# Patient Record
Sex: Male | Born: 1975 | Race: White | Hispanic: No | Marital: Single | Smoking: Never smoker
Health system: Southern US, Community
[De-identification: ages and names within clinical notes are randomized; demographics above are authoritative.]

## PROBLEM LIST (undated history)

## (undated) DIAGNOSIS — I10 Essential (primary) hypertension: Secondary | ICD-10-CM

## (undated) DIAGNOSIS — K219 Gastro-esophageal reflux disease without esophagitis: Secondary | ICD-10-CM

## (undated) DIAGNOSIS — G43909 Migraine, unspecified, not intractable, without status migrainosus: Secondary | ICD-10-CM

## (undated) DIAGNOSIS — Z9889 Other specified postprocedural states: Secondary | ICD-10-CM

## (undated) DIAGNOSIS — R112 Nausea with vomiting, unspecified: Secondary | ICD-10-CM

## (undated) HISTORY — PX: KNEE SURGERY: SHX244

## (undated) HISTORY — PX: SHOULDER SURGERY: SHX246

## (undated) HISTORY — DX: Migraine, unspecified, not intractable, without status migrainosus: G43.909

## (undated) HISTORY — PX: WISDOM TOOTH EXTRACTION: SHX21

---

## 2015-11-20 ENCOUNTER — Ambulatory Visit (HOSPITAL_COMMUNITY)
Admission: EM | Admit: 2015-11-20 | Discharge: 2015-11-20 | Disposition: A | Discharge status: Home / Self Care | Payer: Managed Care, Other (non HMO) | Attending: Family Medicine | Admitting: Family Medicine

## 2015-11-20 ENCOUNTER — Encounter (HOSPITAL_COMMUNITY): Payer: Self-pay | Admitting: *Deleted

## 2015-11-20 DIAGNOSIS — R1030 Lower abdominal pain, unspecified: Secondary | ICD-10-CM

## 2015-11-20 HISTORY — DX: Essential (primary) hypertension: I10

## 2015-11-20 MED ORDER — OMEPRAZOLE 20 MG PO CPDR: 20.0000 mg | DELAYED_RELEASE_CAPSULE | Freq: Every day | ORAL | 0 refills | Status: DC

## 2015-11-20 MED ORDER — POLYETHYLENE GLYCOL 3350 17 GM/SCOOP PO POWD: 1.0000 | Freq: Two times a day (BID) | ORAL | 0 refills | Status: DC

## 2015-11-20 NOTE — Discharge Instructions (Signed)
If you're not better in the next couple days, please call Dr. Russella DarStark for further evaluation.

## 2015-11-20 NOTE — ED Triage Notes (Signed)
Please see providers assessment.

## 2015-11-20 NOTE — ED Provider Notes (Signed)
[facility]    CSN: [id number] Arrival date & time: 11/20/15  1532     History   Chief Complaint Chief Complaint  Patient presents with  . Abdominal Pain    HPI Austin [name] is a 40 y.o. male.   This 40 year old man who presents with abdominal pain. He describes the discomfort as bloating and fullness area gets worse after he eats but it's present all the time. He's had no fever, vomiting, or diarrhea. He has had occasional nausea however. He feels that he is constipated.  The patient works in [employer], [employer]. He's moved down here about a year ago but is not out of personal doctor. Try to get an appointment recently but he still has to wait 3 months for an appointment.  Patient's never had a colonoscopy.      Past Medical History:  Diagnosis Date  . Hypertension     There are no active problems to display for this patient.   Past Surgical History:  Procedure Laterality Date  . KNEE SURGERY    . KNEE SURGERY    . SHOULDER SURGERY         Home Medications    Prior to Admission medications   Medication Sig Start Date End Date Taking? Authorizing Provider  omeprazole (PRILOSEC) 20 MG capsule Take 1 capsule (20 mg total) by mouth daily. 11/20/15   Elvina SidleKurt Akashdeep Chuba, MD  polyethylene glycol powder (MIRALAX) powder Take 255 g by mouth 2 (two) times daily. 11/20/15   Elvina SidleKurt Hilario Robarts, MD    Family History History reviewed. No pertinent family history.  Social History Social History  Substance Use Topics  . Smoking status: Not on file  . Smokeless tobacco: Never Used  . Alcohol use Not on file     Allergies   Review of patient's allergies indicates no known allergies.   Review of Systems Review of Systems   Physical Exam Triage Vital Signs ED Triage Vitals [11/20/15 1607]  Enc Vitals Group     BP 155/95     Pulse Rate 100     Resp 14     Temp 98.2 F (36.8 C)     Temp Source Oral     SpO2 99 %     Weight      Height      Head  Circumference      Peak Flow      Pain Score      Pain Loc      Pain Edu?      Excl. in GC?    No data found.   Updated Vital Signs BP 155/95 (BP Location: Left Arm)   Pulse 100   Temp 98.2 F (36.8 C) (Oral)   Resp 14   SpO2 99%   Physical Exam  Constitutional: He is oriented to person, place, and time. He appears well-developed and well-nourished.  HENT:  Head: Normocephalic.  Right Ear: External ear normal.  Left Ear: External ear normal.  Nose: Nose normal.  Mouth/Throat: Oropharynx is clear and moist.  Eyes: Conjunctivae and EOM are normal. Pupils are equal, round, and reactive to light.  Neck: Normal range of motion. Neck supple.  Cardiovascular: Normal rate, regular rhythm and normal heart sounds.   Pulmonary/Chest: Effort normal and breath sounds normal.  Abdominal: Soft. Bowel sounds are normal. He exhibits no mass. There is tenderness. There is no rebound and no guarding.  Slightly tender in the lower quadrants with no rebound  Musculoskeletal: Normal range of motion.  Neurological: He is alert and oriented to person, place, and time.  Skin: Skin is warm and dry.  Nursing note and vitals reviewed.    UC Treatments / Results  Labs (all labs ordered are listed, but only abnormal results are displayed) Labs Reviewed - No data to display  EKG  EKG Interpretation None       Radiology No results found.  Procedures Procedures (including critical care time)  Medications Ordered in UC Medications - No data to display   Initial Impression / Assessment and Plan / UC Course  I have reviewed the triage vital signs and the nursing notes.  Pertinent labs & imaging results that were available during my care of the patient were reviewed by me and considered in my medical decision making (see chart for details).  Clinical Course    Final Clinical Impressions(s) / UC Diagnoses   Final diagnoses:  Lower abdominal pain    New Prescriptions New  Prescriptions   OMEPRAZOLE (PRILOSEC) 20 MG CAPSULE    Take 1 capsule (20 mg total) by mouth daily.   POLYETHYLENE GLYCOL POWDER (MIRALAX) POWDER    Take 255 g by mouth 2 (two) times daily.     Elvina SidleKurt Jodi Kappes, MD 11/20/15 [phone]

## 2015-11-23 ENCOUNTER — Encounter: Payer: Self-pay | Admitting: Nurse Practitioner

## 2015-11-26 ENCOUNTER — Telehealth: Payer: Self-pay | Admitting: Nurse Practitioner

## 2015-11-26 NOTE — Telephone Encounter (Signed)
No answer. Did not leave a message. Appointment made on Friday for 11/29/15. I can get him in tomorrow. Wanted to offer that to him.

## 2015-11-26 NOTE — Telephone Encounter (Signed)
Appointment scheduled for tomorrow.

## 2015-11-27 ENCOUNTER — Encounter: Payer: Self-pay | Admitting: Nurse Practitioner

## 2015-11-27 ENCOUNTER — Ambulatory Visit (INDEPENDENT_AMBULATORY_CARE_PROVIDER_SITE_OTHER): Payer: Managed Care, Other (non HMO) | Admitting: Nurse Practitioner

## 2015-11-27 ENCOUNTER — Other Ambulatory Visit (INDEPENDENT_AMBULATORY_CARE_PROVIDER_SITE_OTHER): Payer: Managed Care, Other (non HMO)

## 2015-11-27 VITALS — BP 138/80 | HR 100 | Ht 69.0 in | Wt 230.8 lb

## 2015-11-27 DIAGNOSIS — R1030 Lower abdominal pain, unspecified: Secondary | ICD-10-CM | POA: Diagnosis not present

## 2015-11-27 LAB — COMPREHENSIVE METABOLIC PANEL
ALT: 48 U/L (ref 0–53)
AST: 28 U/L (ref 0–37)
Albumin: 4.4 g/dL (ref 3.5–5.2)
Alkaline Phosphatase: 42 U/L (ref 39–117)
BUN: 9 mg/dL (ref 6–23)
CALCIUM: 9.5 mg/dL (ref 8.4–10.5)
CHLORIDE: 102 meq/L (ref 96–112)
CO2: 28 meq/L (ref 19–32)
CREATININE: 0.9 mg/dL (ref 0.40–1.50)
GFR: 100.35 mL/min (ref >60.00)
Glucose, Bld: 95 mg/dL (ref 70–99)
POTASSIUM: 3.8 meq/L (ref 3.5–5.1)
SODIUM: 138 meq/L (ref 135–145)
Total Bilirubin: 0.3 mg/dL (ref 0.2–1.2)
Total Protein: 7.8 g/dL (ref 6.0–8.3)

## 2015-11-27 LAB — CBC WITH DIFFERENTIAL/PLATELET
BASOS PCT: 0.3 % (ref 0.0–3.0)
Basophils Absolute: 0 10*3/uL (ref 0.0–0.1)
EOS ABS: 0.2 10*3/uL (ref 0.0–0.7)
Eosinophils Relative: 3.1 % (ref 0.0–5.0)
HEMATOCRIT: 51.2 % (ref 39.0–52.0)
Hemoglobin: 17.2 g/dL — ABNORMAL HIGH (ref 13.0–17.0)
Lymphocytes Relative: 22.7 % (ref 12.0–46.0)
Lymphs Abs: 1.7 10*3/uL (ref 0.7–4.0)
MCHC: 33.5 g/dL (ref 30.0–36.0)
MCV: 84.9 fl (ref 78.0–100.0)
MONO ABS: 0.9 10*3/uL (ref 0.1–1.0)
Monocytes Relative: 12.2 % — ABNORMAL HIGH (ref 3.0–12.0)
NEUTROS ABS: 4.8 10*3/uL (ref 1.4–7.7)
Neutrophils Relative %: 61.7 % (ref 43.0–77.0)
PLATELETS: 353 10*3/uL (ref 150.0–400.0)
RBC: 6.03 Mil/uL — ABNORMAL HIGH (ref 4.22–5.81)
RDW: 12.5 % (ref 11.5–15.5)
WBC: 7.7 10*3/uL (ref 4.0–10.5)

## 2015-11-27 LAB — URINALYSIS
Bilirubin Urine: NEGATIVE
Hgb urine dipstick: NEGATIVE
Ketones, ur: NEGATIVE
LEUKOCYTES UA: NEGATIVE
NITRITE: NEGATIVE
SPECIFIC GRAVITY, URINE: 1.01 (ref 1.000–1.030)
Total Protein, Urine: NEGATIVE
URINE GLUCOSE: NEGATIVE
UROBILINOGEN UA: 0.2 (ref 0.0–1.0)
pH: 6.5 (ref 5.0–8.0)

## 2015-11-27 NOTE — Patient Instructions (Signed)
Continue Miralax twice a day.   Your physician has requested that you go to the basement for  lab work before leaving today.  You have been scheduled for a CT scan of the abdomen and pelvis at [facility] ([address]---this is in the same building as [facility]).   You are scheduled on 12/06/15 at 10:30 am. You should arrive 15 minutes prior to your appointment time for registration. Please follow the written instructions below on the day of your exam:  WARNING: IF YOU ARE ALLERGIC TO IODINE/X-RAY DYE, PLEASE NOTIFY RADIOLOGY IMMEDIATELY AT [phone]! YOU WILL BE GIVEN A 13 HOUR PREMEDICATION PREP.  1) Do not eat or drink anything after 6:30 am (4 hours prior to your test) 2) You have been given 2 bottles of oral contrast to drink. The solution may taste  better if refrigerated, but do NOT add ice or any other liquid to this solution. Shake well before drinking.    Drink 1 bottle of contrast @ 8:30 am (2 hours prior to your exam)  Drink 1 bottle of contrast @ 9:30 am (1 hour prior to your exam)  You may take any medications as prescribed with a small amount of water except for the following: Metformin, Glucophage, Glucovance, Avandamet, Riomet, Fortamet, Actoplus Met, Janumet, Glumetza or Metaglip. The above medications must be held the day of the exam AND 48 hours after the exam.  The purpose of you drinking the oral contrast is to aid in the visualization of your intestinal tract. The contrast solution may cause some diarrhea. Before your exam is started, you will be given a small amount of fluid to drink. Depending on your individual set of symptoms, you may also receive an intravenous injection of x-ray contrast/dye. Plan on being at [facility] for 30 minutes or longer, depending on the type of exam you are having performed.  This test typically takes 30-45 minutes to complete.  If you have any questions regarding your exam or if you need to reschedule,  you may call the CT department at [phone] between the hours of 8:00 am and 5:00 pm, Monday-Friday.  ________________________________________________________________________

## 2015-11-27 NOTE — Progress Notes (Signed)
HPI: Patient is a 40 year old male here for evaluation of diffuse lower abdominal pain present for nearly three weeks. The pain is constant, pressure like , with intermttent stabbing pains. It is worse with eating but also wakes him up at night. He has some diffuse upper abdominal discomfort at times but feels that is the lower pain actually radiating upward. No nausea, No fevers.  He has decreased frequency of BMs lately but attributes that to not wanting to eat due to the pain. Went to Urgent Care on 10/31, no labs or xrays done. Given prescription for miralax and prilosec but hasn't noticed any improvement in symptoms.  Patient denies fevers / chills / urinary symptoms.   Past Medical History:  Diagnosis Date  . Hypertension   . Migraines     Past Surgical History:  Procedure Laterality Date  . KNEE SURGERY Bilateral   . SHOULDER SURGERY Left    Family History  Problem Relation Age of Onset  . Diabetes Father   . Diabetes Brother   . Kidney disease Brother   . Breast cancer Maternal Grandmother   . Heart disease Maternal Grandmother   . Heart disease Maternal Grandfather   . Heart disease Paternal Grandmother   . Heart disease Paternal Grandfather   . Colon cancer Neg Hx   . Stomach cancer Neg Hx   . Pancreatic cancer Neg Hx   . Esophageal cancer Neg Hx   . Liver disease Neg Hx    Social History  Substance Use Topics  . Smoking status: Never Smoker  . Smokeless tobacco: Never Used  . Alcohol use Yes     Comment: 1-2 drinks daily   Current Outpatient Prescriptions  Medication Sig Dispense Refill  . omeprazole (PRILOSEC) 20 MG capsule Take 1 capsule (20 mg total) by mouth daily. 14 capsule 0  . polyethylene glycol powder (MIRALAX) powder Take 255 g by mouth 2 (two) times daily. (Patient taking differently: Take 17 g by mouth 2 (two) times daily. ) 255 g 0    No Known Allergies  Review of Systems: All systems reviewed and negative except where noted in HPI.     Physical Exam: BP 138/80   Pulse 100   Ht 5\' 9"  (1.753 m)   Wt 230 lb 12.8 oz (104.7 kg)   BMI 34.08 kg/m  Constitutional: Pleasant, obese white male in no acute distress. HEENT: Normocephalic and atraumatic. Conjunctivae are normal. No scleral icterus. Neck supple.  Cardiovascular: Normal rate, regular rhythm.  Pulmonary/chest: Effort normal and breath sounds normal. No wheezing, rales or rhonchi. Abdominal: Soft, nondistended, bowel sounds active throughout. Mild LLQ and lower mid abdominal tenderness. Nontender in RLQ. There are no masses palpable.  Extremities: no edema Lymphadenopathy: No cervical adenopathy noted. Neurological: Alert and oriented to person place and time. Skin: Skin is warm and dry. No rashes noted. Psychiatric: Normal mood and affect. Behavior is normal.   ASSESSMENT AND PLAN:  5[birth year]. 40 year old male with 3 week history of constant ,diffuse lower abdominal pain. He has some diffuse upper abdominal discomfort but feels it is just an extension of the lower pain. No labs or xrays have been done.  BMs less frequent than baseline but not has decreased PO intake due to postprandial pain. Taking Miralax prescribed by PCP last week.   -CMET, CBC -urinalysis -CTscan abd/pelvis with contrast. Will call him with results.   2. Diffuse upper abdominal discomfort. Patient feels upper abdominal discomfort is actually  from the lower pain is radiating upward rather than originating in upper abdomen. No improvement on Prilosec started last week.    Willette Clusteraula Magda Muise  11/27/2015, 2:06 PM

## 2015-11-28 NOTE — Progress Notes (Signed)
Agree with assessment and plan. Will await CT scan, further recommendations pending the result.

## 2015-11-29 ENCOUNTER — Ambulatory Visit: Payer: Managed Care, Other (non HMO) | Admitting: Nurse Practitioner

## 2015-12-03 ENCOUNTER — Encounter: Payer: Self-pay | Admitting: Nurse Practitioner

## 2015-12-03 ENCOUNTER — Telehealth: Payer: Self-pay | Admitting: Gastroenterology

## 2015-12-03 ENCOUNTER — Ambulatory Visit (INDEPENDENT_AMBULATORY_CARE_PROVIDER_SITE_OTHER)
Admission: RE | Admit: 2015-12-03 | Discharge: 2015-12-03 | Disposition: A | Discharge status: Home / Self Care | Payer: Managed Care, Other (non HMO) | Source: Ambulatory Visit | Attending: Nurse Practitioner | Admitting: Nurse Practitioner

## 2015-12-03 DIAGNOSIS — R1030 Lower abdominal pain, unspecified: Secondary | ICD-10-CM | POA: Diagnosis not present

## 2015-12-03 MED ORDER — IOPAMIDOL (ISOVUE-300) INJECTION 61%
100.0000 mL | Freq: Once | INTRAVENOUS | Status: AC | PRN
  Administered 2015-12-03: 100 mL via INTRAVENOUS

## 2015-12-03 NOTE — Telephone Encounter (Signed)
Radiologist called me this evening, on call, with CT report:    IMPRESSION: 1. Equivocal CT findings in the appendiceal tip, which is mildly dilated and demonstrates minimal mucosal hyperenhancement, without periappendiceal fat stranding. Early tip appendicitis cannot be excluded. No findings of perforation or abscess. 2. Diffuse hepatic steatosis. These results were called by telephone at the time of interpretation on 12/03/2015 at 5:28 pm to Dr. Russella DarSTARK covering for NP GUENTHER, who verbally acknowledged these results.  I contacted Willette ClusterPaula Guenther, NP by phone who examined the pt on 11/7 and there were no localizing symptoms. I contacted the patient by phone who report diffuse, intermittent abdominal pains for a few weeks. Symptoms not localizing to periumbilical region or RLQ. No fevers, chills, N/V/D. Pt is eating well and otherwise feels well. I advised him I would review the findings with Dr. Adela LankArmbruster tomorrow. Pt to call if symptoms worsen tonight.

## 2015-12-04 ENCOUNTER — Other Ambulatory Visit: Payer: Self-pay | Admitting: Gastroenterology

## 2015-12-04 DIAGNOSIS — R935 Abnormal findings on diagnostic imaging of other abdominal regions, including retroperitoneum: Secondary | ICD-10-CM

## 2015-12-04 MED ORDER — AMOXICILLIN-POT CLAVULANATE 875-125 MG PO TABS: 1.0000 | ORAL_TABLET | Freq: Two times a day (BID) | ORAL | 0 refills | Status: DC

## 2015-12-04 NOTE — Telephone Encounter (Signed)
Thank you  for update

## 2015-12-04 NOTE — Telephone Encounter (Signed)
Called patient and reviewed CT scan. Some subtle changes of the appendix concerning for possible early appendicitis or perhaps mild low grade chronic appendicitis. I have not seen patient but called him and reviewed his symptoms. Mostly chronic mid lower abdominal pain which sometimes can radiate to the side. Unclear if this change noted on CT is causing his symptoms or not, but no other pathology noted to cause this. I discussed findings at length with him, to include fatty liver.   Recommend we give him a course of antibiotics in case he has chronic appendicitis. Will give Augmentin 875mg  BID for 10 days and see if this helps. In the interim, recommend a referral to general surgery to see if appendectomy is warranted based on CT.   Raynelle FanningJulie, can you please coordinate surgical appointment for him, hopefully sometime this week, earliest appointment / ASAP. I have already ordered his antibiotics. If he develops fever, vomiting, worsening pain, etc, he needs to contact us.  Jettie Boozeaula FYI so you are aware of plan and recommendations.

## 2015-12-05 ENCOUNTER — Encounter: Payer: Self-pay | Admitting: Nurse Practitioner

## 2015-12-05 NOTE — Telephone Encounter (Signed)
Patient scheduled with Dr. Michaell CowingGross at [facility], first available is 12/10/15 at 9:30, arrive at 9:00. Left message on his phone for patient, at his request, with date/time and address. Advised if he had questions or concerns to call us. Records faxed over to [facility].

## 2015-12-06 ENCOUNTER — Inpatient Hospital Stay: Admission: RE | Admit: 2015-12-06 | Payer: Managed Care, Other (non HMO) | Source: Ambulatory Visit

## 2015-12-06 NOTE — Telephone Encounter (Signed)
Thanks for your help in coordinating this so quickly.

## 2015-12-12 ENCOUNTER — Other Ambulatory Visit: Payer: Self-pay | Admitting: Surgery

## 2015-12-12 DIAGNOSIS — R1084 Generalized abdominal pain: Secondary | ICD-10-CM

## 2015-12-18 ENCOUNTER — Ambulatory Visit
Admission: RE | Admit: 2015-12-18 | Discharge: 2015-12-18 | Disposition: A | Discharge status: Home / Self Care | Payer: Managed Care, Other (non HMO) | Source: Ambulatory Visit | Attending: Surgery | Admitting: Surgery

## 2015-12-18 DIAGNOSIS — R1084 Generalized abdominal pain: Secondary | ICD-10-CM

## 2015-12-20 ENCOUNTER — Other Ambulatory Visit (HOSPITAL_COMMUNITY): Payer: Self-pay | Admitting: Surgery

## 2015-12-20 DIAGNOSIS — R1084 Generalized abdominal pain: Secondary | ICD-10-CM

## 2015-12-24 ENCOUNTER — Ambulatory Visit (HOSPITAL_COMMUNITY)
Admission: RE | Admit: 2015-12-24 | Discharge: 2015-12-24 | Disposition: A | Discharge status: Home / Self Care | Payer: Managed Care, Other (non HMO) | Source: Ambulatory Visit | Attending: Surgery | Admitting: Surgery

## 2015-12-24 ENCOUNTER — Ambulatory Visit: Payer: Self-pay | Admitting: Surgery

## 2015-12-24 DIAGNOSIS — K828 Other specified diseases of gallbladder: Secondary | ICD-10-CM | POA: Insufficient documentation

## 2015-12-24 DIAGNOSIS — R1084 Generalized abdominal pain: Secondary | ICD-10-CM

## 2015-12-24 MED ORDER — TECHNETIUM TC 99M MEBROFENIN IV KIT
5.2000 | PACK | Freq: Once | INTRAVENOUS | Status: AC | PRN
  Administered 2015-12-24: 5.2 via INTRAVENOUS

## 2015-12-24 NOTE — H&P (Signed)
Austin [name] 12/10/2015 9:50 AM [facility] Patient #: [id number] DOB: Jun 08, 1975 Single / Language: [place] / Race: White Male   History of Present Illness Ardeth Sportsman MD; 12/10/2015 1:35 PM) The patient is a 40 year old male who presents with non-malignant abdominal pain. Note for "Non-malignant abdominal pain": Patient sent for surgical consultation for abdominal pains and abnormal appendix.  Male. No history of prior gastrointestinal problems or surgery problems. On hollowing, had some sharp abdominal pain. Seen to be in the lower abdomen but then occasionally radiating into the left upper quadrant. Got full and bloated. Gassy. Felt nauseated. Thought to perhaps be more heartburn or reflux. Start on Prilosec. Proved. Still with intermittent attacks. Saw gastroenterology. CT scan ordered. Appendix tip mildly enlarged. Not classic for appendicitis though. Surgical consultation requested. Started on oral Augmentin.  Patient notes the pain seems to be more in the lower abdomen but occasionally will get fullness under his rib cage. It wakes him up his night. He is tried to switch to a more heart healthy diet without any major improvement. Put on Prilosec & MiraLAX. Moves his bowels a couple times a day. His bowels a bit more loose on the Augmentin. He's had periods where he feels pretty uncomfortable for up to a week. That he'll feel okay. He had a time where he seemed to be getting better but then it flared back up again last week. Had a bad day yesterday as well.  No personal nor family history of GI/colon cancer, inflammatory bowel disease, irritable bowel syndrome, allergy such as Celiac Sprue, dietary/dairy problems, colitis, ulcers nor gastritis. No recent sick contacts/gastroenteritis. No travel outside the country. No changes in diet. No dysphagia to solids or liquids. No significant heartburn or reflux. No hematochezia, hematemesis,  coffee ground emesis. No evidence of prior gastric/peptic ulceration.   Other Problems Elease Hashimoto Spillers, CMA; 12/10/2015 9:51 AM) High blood pressure Migraine Headache  Past Surgical History Elease Hashimoto Spillers, CMA; 12/10/2015 9:51 AM) Knee Surgery Bilateral. Shoulder Surgery Left.  Diagnostic Studies History Ethlyn Gallery, CMA; 12/10/2015 9:51 AM) Colonoscopy never  Allergies Elease Hashimoto Spillers, CMA; 12/10/2015 9:52 AM) No Known Drug Allergies11/20/2017  Medication History (Alisha Spillers, CMA; 12/10/2015 9:52 AM) Augmentin (875-125MG  Tablet, Oral) Active. Medications Reconciled  Social History Ethlyn Gallery, CMA; 12/10/2015 9:51 AM) Alcohol use Moderate alcohol use. Caffeine use Carbonated beverages, Coffee. No drug use Tobacco use Never smoker.  Family History Ethlyn Gallery, CMA; 12/10/2015 9:51 AM) Alcohol Abuse Brother, Family Members In General. Arthritis Family Members In General, Mother. Breast Cancer Family Members In General. Diabetes Mellitus Brother, Father. Heart Disease Family Members In General. Hypertension Brother, Family Members In General. Kidney Disease Brother.    Review of Systems Elease Hashimoto Spillers CMA; 12/10/2015 9:51 AM) General Present- Fatigue. Not Present- Appetite Loss, Chills, Fever, Night Sweats, Weight Gain and Weight Loss. Skin Not Present- Change in Wart/Mole, Dryness, Hives, Jaundice, New Lesions, Non-Healing Wounds, Rash and Ulcer. HEENT Not Present- Earache, Hearing Loss, Hoarseness, Nose Bleed, Oral Ulcers, Ringing in the Ears, Seasonal Allergies, Sinus Pain, Sore Throat, Visual Disturbances, Wears glasses/contact lenses and Yellow Eyes. Respiratory Not Present- Bloody sputum, Chronic Cough, Difficulty Breathing, Snoring and Wheezing. Breast Not Present- Breast Mass, Breast Pain, Nipple Discharge and Skin Changes. Cardiovascular Not Present- Chest Pain, Difficulty Breathing Lying Down, Leg Cramps, Palpitations,  Rapid Heart Rate, Shortness of Breath and Swelling of Extremities. Gastrointestinal Present- Abdominal Pain, Bloating, Change in Bowel Habits, Excessive gas, Gets full quickly at meals and Nausea. Not Present- Bloody  Stool, Chronic diarrhea, Constipation, Difficulty Swallowing, Hemorrhoids, Indigestion, Rectal Pain and Vomiting. Male Genitourinary Not Present- Blood in Urine, Change in Urinary Stream, Frequency, Impotence, Nocturia, Painful Urination, Urgency and Urine Leakage. Musculoskeletal Present- Back Pain. Not Present- Joint Pain, Joint Stiffness, Muscle Pain, Muscle Weakness and Swelling of Extremities. Neurological Present- Headaches. Not Present- Decreased Memory, Fainting, Numbness, Seizures, Tingling, Tremor, Trouble walking and Weakness. Psychiatric Not Present- Anxiety, Bipolar, Change in Sleep Pattern, Depression, Fearful and Frequent crying. Endocrine Not Present- Cold Intolerance, Excessive Hunger, Hair Changes, Heat Intolerance, Hot flashes and New Diabetes. Hematology Not Present- Blood Thinners, Easy Bruising, Excessive bleeding, Gland problems, HIV and Persistent Infections.  Vitals (Alisha Spillers CMA; 12/10/2015 9:52 AM) 12/10/2015 9:51 AM Weight: 229 lb Height: 69in Body Surface Area: 2.19 m Body Mass Index: 33.82 kg/m  Pulse: 80 (Regular)  BP: 142/80 (Sitting, Left Arm, Standard)       Physical Exam Ardeth Sportsman(Javarie Crisp C. Ediberto Sens MD; 12/10/2015 10:13 AM) General Mental Status-Alert. General Appearance-Not in acute distress, Not Sickly. Orientation-Oriented X3. Hydration-Well hydrated. Voice-Normal. Note: Called. Relaxed. Not toxic. Not sickly.   Integumentary Global Assessment Upon inspection and palpation of skin surfaces of the - Axillae: non-tender, no inflammation or ulceration, no drainage. and Distribution of scalp and body hair is normal. General Characteristics Temperature - normal warmth is noted.  Head and Neck Head-normocephalic,  atraumatic with no lesions or palpable masses. Face Global Assessment - atraumatic, no absence of expression. Neck Global Assessment - no abnormal movements, no bruit auscultated on the right, no bruit auscultated on the left, no decreased range of motion, non-tender. Trachea-midline. Thyroid Gland Characteristics - non-tender.  Eye Eyeball - Left-Extraocular movements intact, No Nystagmus. Eyeball - Right-Extraocular movements intact, No Nystagmus. Cornea - Left-No Hazy. Cornea - Right-No Hazy. Sclera/Conjunctiva - Left-No scleral icterus, No Discharge. Sclera/Conjunctiva - Right-No scleral icterus, No Discharge. Pupil - Left-Direct reaction to light normal. Pupil - Right-Direct reaction to light normal.  ENMT Ears Pinna - Left - no drainage observed, no generalized tenderness observed. Right - no drainage observed, no generalized tenderness observed. Nose and Sinuses External Inspection of the Nose - no destructive lesion observed. Inspection of the nares - Left - quiet respiration. Right - quiet respiration. Mouth and Throat Lips - Upper Lip - no fissures observed, no pallor noted. Lower Lip - no fissures observed, no pallor noted. Nasopharynx - no discharge present. Oral Cavity/Oropharynx - Tongue - no dryness observed. Oral Mucosa - no cyanosis observed. Hypopharynx - no evidence of airway distress observed.  Chest and Lung Exam Inspection Movements - Normal and Symmetrical. Accessory muscles - No use of accessory muscles in breathing. Palpation Palpation of the chest reveals - Non-tender. Auscultation Breath sounds - Normal and Clear.  Cardiovascular Auscultation Rhythm - Regular. Murmurs & Other Heart Sounds - Auscultation of the heart reveals - No Murmurs and No Systolic Clicks.  Abdomen Inspection Inspection of the abdomen reveals - No Visible peristalsis and No Abnormal pulsations. Umbilicus - No Bleeding, No Urine  drainage. Palpation/Percussion Palpation and Percussion of the abdomen reveal - Soft, Non Tender, No Rebound tenderness, No Rigidity (guarding) and No Cutaneous hyperesthesia. Note: Vague abdominal discomfort in right upper quadrant, right lower quarter, left lower quadrant. No epigastric discomfort. No real periumbilical discomfort. No guarding. No reproduction of pain with cough or bed shake. No diastases. Abdomen soft. Nondistended. No guarding. No umbilical no other hernias   Male Genitourinary Sexual Maturity Tanner 5 - Adult hair pattern and Adult penile size and shape. Note: No inguinal  hernias. Normal external genitalia.   Peripheral Vascular Upper Extremity Inspection - Left - No Cyanotic nailbeds, Not Ischemic. Right - No Cyanotic nailbeds, Not Ischemic.  Neurologic Neurologic evaluation reveals -normal attention span and ability to concentrate, able to name objects and repeat phrases. Appropriate fund of knowledge , normal sensation and normal coordination. Mental Status Affect - not angry, not paranoid. Cranial Nerves-Normal Bilaterally. Gait-Normal.  Neuropsychiatric Mental status exam performed with findings of-able to articulate well with normal speech/language, rate, volume and coherence, thought content normal with ability to perform basic computations and apply abstract reasoning and no evidence of hallucinations, delusions, obsessions or homicidal/suicidal ideation.  Musculoskeletal Global Assessment Spine, Ribs and Pelvis - no instability, subluxation or laxity. Right Upper Extremity - no instability, subluxation or laxity.  Lymphatic Head & Neck  General Head & Neck Lymphatics: Bilateral - Description - No Localized lymphadenopathy. Axillary  General Axillary Region: Bilateral - Description - No Localized lymphadenopathy. Femoral & Inguinal  Generalized Femoral & Inguinal Lymphatics: Left - Description - No Localized lymphadenopathy. Right -  Description - No Localized lymphadenopathy.    Assessment & Plan Ardeth Sportsman(Demtrius Rounds C. Ikey Omary MD; 12/10/2015 1:35 PM) ABDOMINAL PAIN, DIFFUSE (R10.84) Impression: Intermittent episodes of abdominal pain that seemed more classic for biliary colic except for the fact that his pain does not seem to be really right sided and seems to be more lower than upper abdomen. Not classic for appendicitis. CT scan with subtle tip enlargement but no real inflammation. I would think that if he truly had appendicitis, it would be very severe by now. I'm skeptical that he really has chronic, intermittent, recurring appendicitis  I think he would benefit from further testing to rule in/rule out certain potential etiologies.  I would like to get an ultrasound. If that shows gallstones, consider cholecystectomy if everything else is ruled out. If there are no gallstones, get a HIDA scan with gallbladder ejection fraction. If normal GBEF and asymptomatic, that argues against a gallbladder etiology. If decreased gallbladder ejection fraction and reproduction of symptoms, consider cholecystectomy.  Should it come to surgery, I would proceed with appendectomy just in case. I would do a formal diagnostic laparoscopy. Include running the bowel. Rule out any adhesions or other abnormalities. Rule out a Meckel's or other abnormalities. Current Plans Follow Up - Call [facility] office after tests / studies done to discuss further plans Pt Education - [facility] - General recommendations Pt Education - [facility] Good [facility]el [facility] (Wilbert Schouten)  Addendum: Ultrasound shows no gallstones, but HIDA scan shows decreased gallbladder ejection fraction.  Suspicious for chronic cholecystitis.  I recommend laparoscopic cholecystectomy with appendectomy.  Ardeth SportsmanSteven C. Deval Mroczka, M.D., F.A.C.S. Gastrointestinal and [facility]imally Invasive Surgery [facility] [address] [place], [place] [id number] [phone] Main / Paging

## 2015-12-25 ENCOUNTER — Encounter: Payer: Self-pay | Admitting: Nurse Practitioner

## 2016-01-04 NOTE — Telephone Encounter (Signed)
I've called this patient 3 times in the past few days and have not been able to get ahold of him. Left messages, will try calling him again

## 2016-01-09 NOTE — Telephone Encounter (Signed)
Austin FusiPaula, I have called this patient again and left a message. Total 4 calls with messages. Have not heard back from him. If he has further questions I am happy to chat with him and asked him to call me back.

## 2016-01-10 ENCOUNTER — Other Ambulatory Visit (INDEPENDENT_AMBULATORY_CARE_PROVIDER_SITE_OTHER): Payer: Managed Care, Other (non HMO)

## 2016-01-10 ENCOUNTER — Other Ambulatory Visit: Payer: Self-pay

## 2016-01-10 ENCOUNTER — Other Ambulatory Visit: Payer: Self-pay | Admitting: Gastroenterology

## 2016-01-10 ENCOUNTER — Telehealth: Payer: Self-pay | Admitting: Gastroenterology

## 2016-01-10 DIAGNOSIS — R1013 Epigastric pain: Secondary | ICD-10-CM

## 2016-01-10 LAB — IGA: IGA: 391 mg/dL — AB (ref 68–378)

## 2016-01-10 LAB — H. PYLORI ANTIBODY, IGG: H PYLORI IGG: NEGATIVE

## 2016-01-10 NOTE — Telephone Encounter (Signed)
Routed to Dr. Armbruster. 

## 2016-01-10 NOTE — Telephone Encounter (Signed)
Called patient. He wanted to discuss his case further prior to consideration for surgery.  He previously saw PA Wilmon PaliGuenther in her clinic. At that time he complained of lower abdominal pain. He was sent for a CT scan which showed some nonspecific inflammatory changes of the appendix. Due to his lower pain and CT scan findings were referred to general surgery to see Dr. gross for consideration of appendectomy, and he was given a course of antibiotics to treat for possible appendicitis. He reported his lower abdominal pain did resolve after antibiotics by the time he saw Dr. gross his symptoms are mostly postprandial upper abdominal pain is epigastric area. He had an ultrasound showing no gallstones and that a HIDA scan which showed gallbladder dyskinesia. He is currently scheduled for cholecystectomy as well as appendectomy in January.  Given his descriptions of symptoms at this point with his upper abdominal pain I think is very well could be biliary colic. H. pylori, PUD is also on the differential. I asked him to increase his Prilosec to twice a day and will screen him for H. pylori and treat if positive. He also endorses some ongoing bloating as well as intermittent loose stool. I'll also screen for celiac disease with serology. He will get this done today and let him know the results. If these are negative and symptoms persist I think is going to proceed with surgery scheduled. If EGD is needed prior surgery to ensure no other upper tract process we can do that. He agreed.

## 2016-01-11 LAB — TISSUE TRANSGLUTAMINASE, IGA: Tissue Transglutaminase Ab, IgA: 1 U/mL (ref <4)

## 2016-01-18 ENCOUNTER — Other Ambulatory Visit: Payer: Self-pay | Admitting: *Deleted

## 2016-01-18 MED ORDER — OMEPRAZOLE 20 MG PO CPDR: 20.0000 mg | DELAYED_RELEASE_CAPSULE | Freq: Two times a day (BID) | ORAL | 0 refills | Status: AC

## 2016-01-25 NOTE — Patient Instructions (Signed)
Austin PainMichael Taras  01/25/2016   Your procedure is scheduled on: 01/31/2016    [facility]  Entrance [place]  elevators to 3rd floor to  Short Stay Center at   0530 AM.  Call this number if you have problems the morning of surgery [phone]   Remember: ONLY 1 PERSON MAY GO WITH YOU TO SHORT STAY TO GET  READY MORNING OF YOUR SURGERY.  Do not eat food or drink liquids :After Midnight.     Take these medicines the morning of surgery with A SIP OF WATER: prilosec                                 You may not have any metal on your body including hair pins and              piercings  Do not wear jewelry,  lotions, powders or perfumes, deodorant                       Men may shave face and neck.   Do not bring valuables to the hospital. [facility] IS NOT             RESPONSIBLE   FOR VALUABLES.  Contacts, dentures or bridgework may not be worn into surgery.      Patients discharged the day of surgery will not be allowed to drive home.  Name and phone number of your driver:  Special Instructions: N/A              Please read over the following fact sheets you were given: _____________________________________________________________________             [facility] - Preparing [facility] Before surgery, you can play an important role.  Because skin is not sterile, your skin needs to be as free of germs as possible.  You can reduce the number of germs on your skin by washing with CHG (chlorahexidine gluconate) soap before surgery.  CHG is an antiseptic cleaner which kills germs and bonds with the skin to continue killing germs even after washing. Please DO NOT use if you have an allergy to CHG or antibacterial soaps.  If your skin becomes reddened/irritated stop using the CHG and inform your nurse when you arrive at Short Stay. Do not shave (including legs and underarms) for at least 48 hours prior to the first CHG shower.  You may shave your  face/neck. Please follow these instructions carefully:  1.  Shower with CHG Soap the night before surgery and the  morning of Surgery.  2.  If you choose to wash your hair, wash your hair first as usual with your  normal  shampoo.  3.  After you shampoo, rinse your hair and body thoroughly to remove the  shampoo.                           4.  Use CHG as you would any other liquid soap.  You can apply chg directly  to the skin and wash                       Gently with a scrungie or clean washcloth.  5.  Apply the CHG Soap to your body  ONLY FROM THE NECK DOWN.   Do not use on face/ open                           Wound or open sores. Avoid contact with eyes, ears mouth and genitals (private parts).                       Wash face,  Genitals (private parts) with your normal soap.             6.  Wash thoroughly, paying special attention to the area where your surgery  will be performed.  7.  Thoroughly rinse your body with warm water from the neck down.  8.  DO NOT shower/wash with your normal soap after using and rinsing off  the CHG Soap.                9.  Pat yourself dry with a clean towel.            10.  Wear clean pajamas.            11.  Place clean sheets on your bed the night of your first shower and do not  sleep with pets. Day of Surgery : Do not apply any lotions/deodorants the morning of surgery.  Please wear clean clothes to the hospital/surgery center.  FAILURE TO FOLLOW THESE INSTRUCTIONS MAY RESULT IN THE CANCELLATION OF YOUR SURGERY PATIENT SIGNATURE_________________________________  NURSE SIGNATURE__________________________________  ________________________________________________________________________

## 2016-01-28 ENCOUNTER — Encounter (HOSPITAL_COMMUNITY): Payer: Self-pay

## 2016-01-28 ENCOUNTER — Encounter (HOSPITAL_COMMUNITY)
Admission: RE | Admit: 2016-01-28 | Discharge: 2016-01-28 | Disposition: A | Discharge status: Home / Self Care | Payer: Managed Care, Other (non HMO) | Source: Ambulatory Visit | Attending: Surgery | Admitting: Surgery

## 2016-01-28 DIAGNOSIS — Z811 Family history of alcohol abuse and dependence: Secondary | ICD-10-CM | POA: Diagnosis not present

## 2016-01-28 DIAGNOSIS — K3580 Unspecified acute appendicitis: Secondary | ICD-10-CM | POA: Diagnosis not present

## 2016-01-28 DIAGNOSIS — Z841 Family history of disorders of kidney and ureter: Secondary | ICD-10-CM | POA: Diagnosis not present

## 2016-01-28 DIAGNOSIS — Z8261 Family history of arthritis: Secondary | ICD-10-CM | POA: Diagnosis not present

## 2016-01-28 DIAGNOSIS — K760 Fatty (change of) liver, not elsewhere classified: Secondary | ICD-10-CM | POA: Diagnosis not present

## 2016-01-28 DIAGNOSIS — Z833 Family history of diabetes mellitus: Secondary | ICD-10-CM | POA: Diagnosis not present

## 2016-01-28 DIAGNOSIS — K811 Chronic cholecystitis: Secondary | ICD-10-CM | POA: Insufficient documentation

## 2016-01-28 DIAGNOSIS — K389 Disease of appendix, unspecified: Secondary | ICD-10-CM | POA: Insufficient documentation

## 2016-01-28 DIAGNOSIS — Z8489 Family history of other specified conditions: Secondary | ICD-10-CM | POA: Diagnosis not present

## 2016-01-28 DIAGNOSIS — Z01812 Encounter for preprocedural laboratory examination: Secondary | ICD-10-CM

## 2016-01-28 DIAGNOSIS — Z01810 Encounter for preprocedural cardiovascular examination: Secondary | ICD-10-CM | POA: Insufficient documentation

## 2016-01-28 DIAGNOSIS — I10 Essential (primary) hypertension: Secondary | ICD-10-CM | POA: Diagnosis not present

## 2016-01-28 DIAGNOSIS — Z8249 Family history of ischemic heart disease and other diseases of the circulatory system: Secondary | ICD-10-CM | POA: Diagnosis not present

## 2016-01-28 DIAGNOSIS — Z803 Family history of malignant neoplasm of breast: Secondary | ICD-10-CM | POA: Diagnosis not present

## 2016-01-28 DIAGNOSIS — K8010 Calculus of gallbladder with chronic cholecystitis without obstruction: Secondary | ICD-10-CM | POA: Diagnosis present

## 2016-01-28 DIAGNOSIS — G43909 Migraine, unspecified, not intractable, without status migrainosus: Secondary | ICD-10-CM | POA: Diagnosis not present

## 2016-01-28 HISTORY — DX: Other specified postprocedural states: Z98.890

## 2016-01-28 HISTORY — DX: Nausea with vomiting, unspecified: R11.2

## 2016-01-28 HISTORY — DX: Gastro-esophageal reflux disease without esophagitis: K21.9

## 2016-01-28 LAB — CBC
HEMATOCRIT: 50.5 % (ref 39.0–52.0)
HEMOGLOBIN: 17.2 g/dL — AB (ref 13.0–17.0)
MCH: 29.2 pg (ref 26.0–34.0)
MCHC: 34.1 g/dL (ref 30.0–36.0)
MCV: 85.7 fL (ref 78.0–100.0)
Platelets: 331 10*3/uL (ref 150–400)
RBC: 5.89 MIL/uL — AB (ref 4.22–5.81)
RDW: 12.6 % (ref 11.5–15.5)
WBC: 8.2 10*3/uL (ref 4.0–10.5)

## 2016-01-28 NOTE — Progress Notes (Addendum)
Patient reports " battling a sore throat this weekend.  " Instructed patient to call and see PCP prior to surgery. Patient voiced understanding.  Blood pressure elevated at preop appt of 160/97 and 165/104.  Patient reports PCP has been contemplating placing on blood pressure medication.   CBC/BMP and EKG done at preop.

## 2016-01-28 NOTE — Progress Notes (Signed)
Final EKG done 01/28/16- [facility]  

## 2016-01-31 ENCOUNTER — Ambulatory Visit (HOSPITAL_COMMUNITY): Payer: Managed Care, Other (non HMO) | Admitting: Registered Nurse

## 2016-01-31 ENCOUNTER — Ambulatory Visit (HOSPITAL_COMMUNITY): Payer: Managed Care, Other (non HMO)

## 2016-01-31 ENCOUNTER — Ambulatory Visit (HOSPITAL_COMMUNITY)
Admission: RE | Admit: 2016-01-31 | Discharge: 2016-01-31 | Disposition: A | Discharge status: Home / Self Care | Payer: Managed Care, Other (non HMO) | Source: Ambulatory Visit | Attending: Surgery | Admitting: Surgery

## 2016-01-31 ENCOUNTER — Encounter (HOSPITAL_COMMUNITY): Payer: Self-pay | Admitting: *Deleted

## 2016-01-31 ENCOUNTER — Encounter (HOSPITAL_COMMUNITY)
Admission: RE | Disposition: A | Discharge status: Home / Self Care | Payer: Self-pay | Source: Ambulatory Visit | Attending: Surgery

## 2016-01-31 DIAGNOSIS — Z8489 Family history of other specified conditions: Secondary | ICD-10-CM | POA: Insufficient documentation

## 2016-01-31 DIAGNOSIS — K7581 Nonalcoholic steatohepatitis (NASH): Secondary | ICD-10-CM

## 2016-01-31 DIAGNOSIS — Z8261 Family history of arthritis: Secondary | ICD-10-CM | POA: Insufficient documentation

## 2016-01-31 DIAGNOSIS — K811 Chronic cholecystitis: Secondary | ICD-10-CM | POA: Insufficient documentation

## 2016-01-31 DIAGNOSIS — Z419 Encounter for procedure for purposes other than remedying health state, unspecified: Secondary | ICD-10-CM

## 2016-01-31 DIAGNOSIS — Z803 Family history of malignant neoplasm of breast: Secondary | ICD-10-CM | POA: Insufficient documentation

## 2016-01-31 DIAGNOSIS — Z841 Family history of disorders of kidney and ureter: Secondary | ICD-10-CM | POA: Insufficient documentation

## 2016-01-31 DIAGNOSIS — K760 Fatty (change of) liver, not elsewhere classified: Secondary | ICD-10-CM | POA: Insufficient documentation

## 2016-01-31 DIAGNOSIS — K36 Other appendicitis: Secondary | ICD-10-CM | POA: Diagnosis present

## 2016-01-31 DIAGNOSIS — K8010 Calculus of gallbladder with chronic cholecystitis without obstruction: Secondary | ICD-10-CM | POA: Diagnosis present

## 2016-01-31 DIAGNOSIS — K3580 Unspecified acute appendicitis: Secondary | ICD-10-CM | POA: Insufficient documentation

## 2016-01-31 DIAGNOSIS — G43909 Migraine, unspecified, not intractable, without status migrainosus: Secondary | ICD-10-CM | POA: Insufficient documentation

## 2016-01-31 DIAGNOSIS — Z811 Family history of alcohol abuse and dependence: Secondary | ICD-10-CM | POA: Insufficient documentation

## 2016-01-31 DIAGNOSIS — Z833 Family history of diabetes mellitus: Secondary | ICD-10-CM | POA: Insufficient documentation

## 2016-01-31 DIAGNOSIS — Z8249 Family history of ischemic heart disease and other diseases of the circulatory system: Secondary | ICD-10-CM | POA: Insufficient documentation

## 2016-01-31 DIAGNOSIS — I10 Essential (primary) hypertension: Secondary | ICD-10-CM | POA: Insufficient documentation

## 2016-01-31 HISTORY — PX: CHOLECYSTECTOMY: SHX55

## 2016-01-31 SURGERY — LAPAROSCOPIC CHOLECYSTECTOMY WITH INTRAOPERATIVE CHOLANGIOGRAM
Anesthesia: General | Site: Abdomen

## 2016-01-31 MED ORDER — FENTANYL CITRATE (PF) 100 MCG/2ML IJ SOLN
25.0000 ug | INTRAMUSCULAR | Status: DC | PRN
  Administered 2016-01-31 (×2): 50 ug via INTRAVENOUS

## 2016-01-31 MED ORDER — LACTATED RINGERS IR SOLN
Status: DC | PRN
  Administered 2016-01-31: 1000 mL

## 2016-01-31 MED ORDER — MIDAZOLAM HCL 2 MG/2ML IJ SOLN
INTRAMUSCULAR | Status: AC
  Filled 2016-01-31: qty 2

## 2016-01-31 MED ORDER — BUPIVACAINE HCL 0.25 % IJ SOLN
INTRAMUSCULAR | Status: DC | PRN
  Administered 2016-01-31: 60 mL

## 2016-01-31 MED ORDER — DEXAMETHASONE SODIUM PHOSPHATE 10 MG/ML IJ SOLN
INTRAMUSCULAR | Status: AC
  Filled 2016-01-31: qty 1

## 2016-01-31 MED ORDER — ROCURONIUM BROMIDE 10 MG/ML (PF) SYRINGE
PREFILLED_SYRINGE | INTRAVENOUS | Status: DC | PRN
  Administered 2016-01-31: 50 mg via INTRAVENOUS
  Administered 2016-01-31: 10 mg via INTRAVENOUS
  Administered 2016-01-31: 5 mg via INTRAVENOUS

## 2016-01-31 MED ORDER — ONDANSETRON HCL 4 MG/2ML IJ SOLN
INTRAMUSCULAR | Status: AC
  Filled 2016-01-31: qty 2

## 2016-01-31 MED ORDER — CEFOTETAN DISODIUM-DEXTROSE 2-2.08 GM-% IV SOLR
INTRAVENOUS | Status: AC
  Filled 2016-01-31: qty 50

## 2016-01-31 MED ORDER — IOPAMIDOL (ISOVUE-300) INJECTION 61%
INTRAVENOUS | Status: AC
  Filled 2016-01-31: qty 50

## 2016-01-31 MED ORDER — SODIUM CHLORIDE 0.9 % IV SOLN: 250.0000 mL | INTRAVENOUS | Status: DC | PRN

## 2016-01-31 MED ORDER — ACETAMINOPHEN 650 MG RE SUPP
650.0000 mg | RECTAL | Status: DC | PRN
  Filled 2016-01-31: qty 1

## 2016-01-31 MED ORDER — FENTANYL CITRATE (PF) 100 MCG/2ML IJ SOLN
INTRAMUSCULAR | Status: AC
  Administered 2016-01-31: 50 ug via INTRAVENOUS
  Filled 2016-01-31: qty 2

## 2016-01-31 MED ORDER — IOPAMIDOL (ISOVUE-300) INJECTION 61%
INTRAVENOUS | Status: DC | PRN
  Administered 2016-01-31: 16 mL via INTRAVENOUS

## 2016-01-31 MED ORDER — SUCCINYLCHOLINE CHLORIDE 200 MG/10ML IV SOSY
PREFILLED_SYRINGE | INTRAVENOUS | Status: DC | PRN
  Administered 2016-01-31: 100 mg via INTRAVENOUS

## 2016-01-31 MED ORDER — LABETALOL HCL 5 MG/ML IV SOLN
INTRAVENOUS | Status: DC | PRN
  Administered 2016-01-31: 2.5 mg via INTRAVENOUS

## 2016-01-31 MED ORDER — GABAPENTIN 300 MG PO CAPS
300.0000 mg | ORAL_CAPSULE | ORAL | Status: AC
  Administered 2016-01-31: 300 mg via ORAL
  Filled 2016-01-31: qty 1

## 2016-01-31 MED ORDER — LACTATED RINGERS IV SOLN
INTRAVENOUS | Status: DC | PRN
  Administered 2016-01-31: 07:00:00 via INTRAVENOUS

## 2016-01-31 MED ORDER — MIDAZOLAM HCL 5 MG/5ML IJ SOLN
INTRAMUSCULAR | Status: DC | PRN
  Administered 2016-01-31: 2 mg via INTRAVENOUS

## 2016-01-31 MED ORDER — ROCURONIUM BROMIDE 50 MG/5ML IV SOSY
PREFILLED_SYRINGE | INTRAVENOUS | Status: AC
  Filled 2016-01-31: qty 5

## 2016-01-31 MED ORDER — LABETALOL HCL 5 MG/ML IV SOLN
INTRAVENOUS | Status: AC
  Filled 2016-01-31: qty 4

## 2016-01-31 MED ORDER — OXYCODONE HCL 5 MG PO TABS: 5.0000 mg | ORAL_TABLET | ORAL | 0 refills | Status: AC | PRN

## 2016-01-31 MED ORDER — STERILE WATER FOR IRRIGATION IR SOLN
Status: DC | PRN
  Administered 2016-01-31: 1000 mL

## 2016-01-31 MED ORDER — OXYCODONE HCL 5 MG PO TABS
5.0000 mg | ORAL_TABLET | ORAL | Status: DC | PRN
  Administered 2016-01-31: 5 mg via ORAL
  Filled 2016-01-31: qty 1

## 2016-01-31 MED ORDER — PROPOFOL 10 MG/ML IV BOLUS
INTRAVENOUS | Status: AC
  Filled 2016-01-31: qty 40

## 2016-01-31 MED ORDER — OXYCODONE HCL 5 MG PO TABS: 5.0000 mg | ORAL_TABLET | Freq: Once | ORAL | Status: DC | PRN

## 2016-01-31 MED ORDER — SODIUM CHLORIDE 0.9% FLUSH: 3.0000 mL | Freq: Two times a day (BID) | INTRAVENOUS | Status: DC

## 2016-01-31 MED ORDER — 0.9 % SODIUM CHLORIDE (POUR BTL) OPTIME
TOPICAL | Status: DC | PRN
  Administered 2016-01-31 (×2): 1000 mL

## 2016-01-31 MED ORDER — ACETAMINOPHEN 325 MG PO TABS: 650.0000 mg | ORAL_TABLET | ORAL | Status: DC | PRN

## 2016-01-31 MED ORDER — SUGAMMADEX SODIUM 200 MG/2ML IV SOLN
INTRAVENOUS | Status: DC | PRN
  Administered 2016-01-31: 200 mg via INTRAVENOUS

## 2016-01-31 MED ORDER — SUGAMMADEX SODIUM 200 MG/2ML IV SOLN
INTRAVENOUS | Status: AC
  Filled 2016-01-31: qty 2

## 2016-01-31 MED ORDER — ONDANSETRON HCL 4 MG/2ML IJ SOLN
INTRAMUSCULAR | Status: DC | PRN
  Administered 2016-01-31: 4 mg via INTRAVENOUS

## 2016-01-31 MED ORDER — CEFOTETAN DISODIUM-DEXTROSE 2-2.08 GM-% IV SOLR
2.0000 g | INTRAVENOUS | Status: AC
  Administered 2016-01-31: 2 g via INTRAVENOUS

## 2016-01-31 MED ORDER — LACTATED RINGERS IV SOLN: INTRAVENOUS | Status: DC

## 2016-01-31 MED ORDER — OXYCODONE HCL 5 MG/5ML PO SOLN: 5.0000 mg | Freq: Once | ORAL | Status: DC | PRN

## 2016-01-31 MED ORDER — LIDOCAINE HCL (CARDIAC) 20 MG/ML IV SOLN
INTRAVENOUS | Status: DC | PRN
  Administered 2016-01-31: 100 mg via INTRAVENOUS

## 2016-01-31 MED ORDER — FENTANYL CITRATE (PF) 100 MCG/2ML IJ SOLN
INTRAMUSCULAR | Status: DC | PRN
  Administered 2016-01-31 (×4): 50 ug via INTRAVENOUS

## 2016-01-31 MED ORDER — SUCCINYLCHOLINE CHLORIDE 200 MG/10ML IV SOSY
PREFILLED_SYRINGE | INTRAVENOUS | Status: AC
Start: 2016-01-31 — End: 2016-01-31
  Filled 2016-01-31: qty 10

## 2016-01-31 MED ORDER — BUPIVACAINE HCL 0.25 % IJ SOLN
INTRAMUSCULAR | Status: AC
  Filled 2016-01-31: qty 2

## 2016-01-31 MED ORDER — FENTANYL CITRATE (PF) 100 MCG/2ML IJ SOLN
INTRAMUSCULAR | Status: AC
  Filled 2016-01-31: qty 4

## 2016-01-31 MED ORDER — DEXAMETHASONE SODIUM PHOSPHATE 10 MG/ML IJ SOLN
INTRAMUSCULAR | Status: DC | PRN
  Administered 2016-01-31: 10 mg via INTRAVENOUS

## 2016-01-31 MED ORDER — FENTANYL CITRATE (PF) 100 MCG/2ML IJ SOLN: 25.0000 ug | INTRAMUSCULAR | Status: DC | PRN

## 2016-01-31 MED ORDER — ONDANSETRON HCL 4 MG/2ML IJ SOLN
4.0000 mg | Freq: Once | INTRAMUSCULAR | Status: AC | PRN
  Administered 2016-01-31: 4 mg via INTRAVENOUS
  Filled 2016-01-31: qty 2

## 2016-01-31 MED ORDER — LIDOCAINE 2% (20 MG/ML) 5 ML SYRINGE
INTRAMUSCULAR | Status: AC
  Filled 2016-01-31: qty 5

## 2016-01-31 MED ORDER — NAPROXEN 500 MG PO TABS: 500.0000 mg | ORAL_TABLET | Freq: Two times a day (BID) | ORAL | 1 refills | Status: AC | PRN

## 2016-01-31 MED ORDER — ACETAMINOPHEN 500 MG PO TABS
1000.0000 mg | ORAL_TABLET | ORAL | Status: AC
  Administered 2016-01-31: 1000 mg via ORAL
  Filled 2016-01-31: qty 2

## 2016-01-31 MED ORDER — SODIUM CHLORIDE 0.9% FLUSH: 3.0000 mL | INTRAVENOUS | Status: DC | PRN

## 2016-01-31 MED ORDER — PROPOFOL 10 MG/ML IV BOLUS
INTRAVENOUS | Status: DC | PRN
  Administered 2016-01-31: 50 mg via INTRAVENOUS
  Administered 2016-01-31: 250 mg via INTRAVENOUS

## 2016-01-31 MED ORDER — CELECOXIB 200 MG PO CAPS
400.0000 mg | ORAL_CAPSULE | ORAL | Status: AC
  Administered 2016-01-31: 400 mg via ORAL
  Filled 2016-01-31: qty 2

## 2016-01-31 SURGICAL SUPPLY — 43 items
APPLIER CLIP 5 13 M/L LIGAMAX5 (MISCELLANEOUS)
APPLIER CLIP ROT 10 11.4 M/L (STAPLE)
CABLE HIGH FREQUENCY MONO STRZ (ELECTRODE) ×3 IMPLANT
CLIP APPLIE 5 13 M/L LIGAMAX5 (MISCELLANEOUS) IMPLANT
CLIP APPLIE ROT 10 11.4 M/L (STAPLE) IMPLANT
COVER MAYO STAND STRL (DRAPES) ×3 IMPLANT
COVER SURGICAL LIGHT HANDLE (MISCELLANEOUS) ×3 IMPLANT
CUTTER FLEX LINEAR 45M (STAPLE) ×3 IMPLANT
DECANTER SPIKE VIAL GLASS SM (MISCELLANEOUS) ×3 IMPLANT
DEVICE TROCAR PUNCTURE CLOSURE (ENDOMECHANICALS) ×3 IMPLANT
DRAPE C-ARM 42X120 X-RAY (DRAPES) ×3 IMPLANT
DRAPE UTILITY XL STRL (DRAPES) ×3 IMPLANT
DRAPE WARM FLUID 44X44 (DRAPE) ×3 IMPLANT
DRSG TEGADERM 2-3/8X2-3/4 SM (GAUZE/BANDAGES/DRESSINGS) ×9 IMPLANT
DRSG TEGADERM 4X4.75 (GAUZE/BANDAGES/DRESSINGS) ×3 IMPLANT
ELECT REM PT RETURN 9FT ADLT (ELECTROSURGICAL) ×3
ELECTRODE REM PT RTRN 9FT ADLT (ELECTROSURGICAL) ×1 IMPLANT
ENDOLOOP SUT PDS II  0 18 (SUTURE)
ENDOLOOP SUT PDS II 0 18 (SUTURE) IMPLANT
GAUZE SPONGE 2X2 8PLY STRL LF (GAUZE/BANDAGES/DRESSINGS) ×1 IMPLANT
GLOVE ECLIPSE 8.0 STRL XLNG CF (GLOVE) ×3 IMPLANT
GLOVE INDICATOR 8.0 STRL GRN (GLOVE) ×3 IMPLANT
GOWN STRL REUS W/TWL XL LVL3 (GOWN DISPOSABLE) ×12 IMPLANT
IRRIG SUCT STRYKERFLOW 2 WTIP (MISCELLANEOUS) ×3
IRRIGATION SUCT STRKRFLW 2 WTP (MISCELLANEOUS) ×1 IMPLANT
KIT BASIN OR (CUSTOM PROCEDURE TRAY) ×3 IMPLANT
NEEDLE BIOPSY 14X6 SOFT TISS (NEEDLE) ×3 IMPLANT
PAD TELFA 2X3 NADH STRL (GAUZE/BANDAGES/DRESSINGS) ×3 IMPLANT
POSITIONER SURGICAL ARM (MISCELLANEOUS) ×3 IMPLANT
POUCH SPECIMEN RETRIEVAL 10MM (ENDOMECHANICALS) ×6 IMPLANT
SCISSORS LAP 5X35 DISP (ENDOMECHANICALS) ×3 IMPLANT
SET CHOLANGIOGRAPH MIX (MISCELLANEOUS) ×3 IMPLANT
SLEEVE XCEL OPT CAN 5 100 (ENDOMECHANICALS) ×9 IMPLANT
SPONGE GAUZE 2X2 STER 10/PKG (GAUZE/BANDAGES/DRESSINGS) ×2
SUT MNCRL AB 4-0 PS2 18 (SUTURE) ×6 IMPLANT
SUT PDS AB 1 CT1 27 (SUTURE) ×6 IMPLANT
SYR 20CC LL (SYRINGE) ×3 IMPLANT
TOWEL OR 17X26 10 PK STRL BLUE (TOWEL DISPOSABLE) ×3 IMPLANT
TOWEL OR NON WOVEN STRL DISP B (DISPOSABLE) ×3 IMPLANT
TRAY LAPAROSCOPIC (CUSTOM PROCEDURE TRAY) ×3 IMPLANT
TROCAR BLADELESS OPT 5 100 (ENDOMECHANICALS) ×3 IMPLANT
TROCAR XCEL NON-BLD 11X100MML (ENDOMECHANICALS) IMPLANT
TUBING INSUF HEATED (TUBING) ×6 IMPLANT

## 2016-01-31 NOTE — Anesthesia Postprocedure Evaluation (Signed)
Anesthesia Post Note  Patient: Austin [name]  Procedure(s) Performed: Procedure(s) (LRB): LAPAROSCOPIC APPENDECTOMY AND  CHOLECYSTECTOMY WITH INTRAOPERATIVE CHOLANGIOGRAM, DIAGNOSTIC LAPAROSCOPY, CORE NEEDLE BIOPSY OF LIVER (N/A)  Patient location during evaluation: PACU Anesthesia Type: General Level of consciousness: awake, awake and alert and oriented Pain management: pain level controlled Vital Signs Assessment: post-procedure vital signs reviewed and stable Respiratory status: spontaneous breathing, nonlabored ventilation and respiratory function stable Cardiovascular status: blood pressure returned to baseline Anesthetic complications: no       Last Vitals:  Vitals:   01/31/16 1120 01/31/16 1514  BP: (!) 131/91 (!) 142/88  Pulse: (!) 105 (!) 111  Resp: 16 18  Temp: 37.1 C 37.1 C    Last Pain:  Vitals:   01/31/16 1514  TempSrc: Oral  PainSc: 2                  Austin [name] COKER

## 2016-01-31 NOTE — Interval H&P Note (Signed)
History and Physical Interval Note:  01/31/2016 7:41 AM  Austin PainMichael [name]  has presented today for surgery, with the diagnosis of SYMPTOMATIC BILIAR COLIC PROBABLE CHRONIC CHOLECYSTITIS   The various methods of treatment have been discussed with the patient and family. After consideration of risks, benefits and other options for treatment, the patient has consented to  Procedure(s): LAPAROSCOPIC APPENDECTOMY AND  CHOLECYSTECTOMY WITH INTRAOPERATIVE CHOLANGIOGRAM (N/A) as a surgical intervention .  The patient's history has been reviewed, patient examined, no change in status, stable for surgery.  I have reviewed the patient's chart and labs.  Questions were answered to the patient's satisfaction.     Alanson Hausmann C.

## 2016-01-31 NOTE — Transfer of Care (Signed)
Immediate Anesthesia Transfer of Care Note  Patient: Austin PainMichael [name]  Procedure(s) Performed: Procedure(s): LAPAROSCOPIC APPENDECTOMY AND  CHOLECYSTECTOMY WITH INTRAOPERATIVE CHOLANGIOGRAM, DIAGNOSTIC LAPAROSCOPY, CORE NEEDLE BIOPSY OF LIVER (N/A)  Patient Location: PACU  Anesthesia Type:General  Level of Consciousness: awake, alert , oriented and patient cooperative  Airway & Oxygen Therapy: Patient Spontanous Breathing and Patient connected to face mask oxygen  Post-op Assessment: Report given to RN, Post -op Vital signs reviewed and stable and Patient moving all extremities  Post vital signs: Reviewed and stable  Last Vitals:  Vitals:   01/31/16 0555 01/31/16 0613  BP: (!) 151/106 (!) 148/90  Pulse: 100   Resp: 18 18  Temp: 36.5 C     Last [name]:  Vitals:   01/31/16 0555  TempSrc: Oral      Patients Stated [name] Goal: 5 (01/31/16 0555)  Complications: No apparent anesthesia complications

## 2016-01-31 NOTE — Discharge Instructions (Signed)
General Anesthesia, Adult, Care After These instructions provide you with information about caring for yourself after your procedure. Your health care provider may also give you more specific instructions. Your treatment has been planned according to current medical practices, but problems sometimes occur. Call your health care provider if you have any problems or questions after your procedure. What can I expect after the procedure? After the procedure, it is common to have:  Vomiting.  A sore throat.  Mental slowness. It is common to feel:  Nauseous.  Cold or shivery.  Sleepy.  Tired.  Sore or achy, even in parts of your body where you did not have surgery. Follow these instructions at home: For at least 24 hours after the procedure:  Do not:  Participate in activities where you could fall or become injured.  Drive.  Use heavy machinery.  Drink alcohol.  Take sleeping pills or medicines that cause drowsiness.  Make important decisions or sign legal documents.  Take care of children on your own.  Rest. Eating and drinking  If you vomit, drink water, juice, or soup when you can drink without vomiting.  Drink enough fluid to keep your urine clear or pale yellow.  Make sure you have little or no nausea before eating solid foods.  Follow the diet recommended by your health care provider. General instructions  Have a responsible adult stay with you until you are awake and alert.  Return to your normal activities as told by your health care provider. Ask your health care provider what activities are safe for you.  Take over-the-counter and prescription medicines only as told by your health care provider.  If you smoke, do not smoke without supervision.  Keep all follow-up visits as told by your health care provider. This is important. Contact a health care provider if:  You continue to have nausea or vomiting at home, and medicines are not helpful.  You  cannot drink fluids or start eating again.  You cannot urinate after 8-12 hours.  You develop a skin rash.  You have fever.  You have increasing redness at the site of your procedure. Get help right away if:  You have difficulty breathing.  You have chest pain.  You have unexpected bleeding.  You feel that you are having a life-threatening or urgent problem. This information is not intended to replace advice given to you by your health care provider. Make sure you discuss any questions you have with your health care provider. Document Released: 04/14/2000 Document Revised: 06/11/2015 Document Reviewed: 12/21/2014 Elsevier Interactive Patient Education  2017 Elsevier Inc.   LAPAROSCOPIC SURGERY: POST OP INSTRUCTIONS  ######################################################################  EAT Gradually transition to a high fiber diet with a fiber supplement over the next few weeks after discharge.  Start with a pureed / full liquid diet (see below)  WALK Walk an hour a day.  Control your pain to do that.    CONTROL PAIN Control pain so that you can walk, sleep, tolerate sneezing/coughing, go up/down stairs.  HAVE A BOWEL MOVEMENT DAILY Keep your bowels regular to avoid problems.  OK to try a laxative to override constipation.  OK to use an antidairrheal to slow down diarrhea.  Call if not better after 2 tries  CALL IF YOU HAVE PROBLEMS/CONCERNS Call if you are still struggling despite following these instructions. Call if you have concerns not answered by these instructions  ######################################################################    1. DIET: Follow a light bland diet the first 24 hours after arrival  home, such as soup, liquids, crackers, etc.  Be sure to include lots of fluids daily.  Avoid fast food or heavy meals as your are more likely to get nauseated.  Eat a low fat the next few days after surgery.   2. Take your usually prescribed home medications  unless otherwise directed. 3. PAIN CONTROL: a. Pain is best controlled by a usual combination of three different methods TOGETHER: i. Ice/Heat ii. Over the counter pain medication iii. Prescription pain medication b. Most patients will experience some swelling and bruising around the incisions.  Ice packs or heating pads (30-60 minutes up to 6 times a day) will help. Use ice for the first few days to help decrease swelling and bruising, then switch to heat to help relax tight/sore spots and speed recovery.  Some people prefer to use ice alone, heat alone, alternating between ice & heat.  Experiment to what works for you.  Swelling and bruising can take several weeks to resolve.   c. It is helpful to take an over-the-counter pain medication regularly for the first few weeks.  Choose one of the following that works best for you: i. Naproxen (Aleve, etc)  Two 220mg  tabs twice a day ii. Ibuprofen (Advil, etc) Three 200mg  tabs four times a day (every meal & bedtime) iii. Acetaminophen (Tylenol, etc) 500-650mg  four times a day (every meal & bedtime) d. A  prescription for pain medication (such as oxycodone, hydrocodone, etc) should be given to you upon discharge.  Take your pain medication as prescribed.  i. If you are having problems/concerns with the prescription medicine (does not control pain, nausea, vomiting, rash, itching, etc), please call us [phone] to see if we need to switch you to a different pain medicine that will work better for you and/or control your side effect better. ii. If you need a refill on your pain medication, please contact your pharmacy.  They will contact our office to request authorization. Prescriptions will not be filled after 5 pm or on week-ends. 4. Avoid getting constipated.  Between the surgery and the pain medications, it is common to experience some constipation.  Increasing fluid intake and taking a fiber supplement (such as Metamucil, Citrucel, FiberCon,  MiraLax, etc) 1-2 times a day regularly will usually help prevent this problem from occurring.  A mild laxative (prune juice, Milk of Magnesia, MiraLax, etc) should be taken according to package directions if there are no bowel movements after 48 hours.   5. Watch out for diarrhea.  If you have many loose bowel movements, simplify your diet to bland foods & liquids for a few days.  Stop any stool softeners and decrease your fiber supplement.  Switching to mild anti-diarrheal medications (Kayopectate, Pepto Bismol) can help.  If this worsens or does not improve, please call us. 6. Wash / shower every day.  You may shower over the dressings as they are waterproof.  Continue to shower over incision(s) after the dressing is off. 7. Remove your waterproof bandages 5 days after surgery.  You may leave the incision open to air.  You may replace a dressing/Band-Aid to cover the incision for comfort if you wish.  8. ACTIVITIES as tolerated:   a. You may resume regular (light) daily activities beginning the next day--such as daily self-care, walking, climbing stairs--gradually increasing activities as tolerated.  If you can walk 30 minutes without difficulty, it is safe to try more intense activity such as jogging, treadmill, bicycling, low-impact aerobics, swimming, etc. b. Save  the most intensive and strenuous activity for last such as sit-ups, heavy lifting, contact sports, etc  Refrain from any heavy lifting or straining until you are off narcotics for pain control.   c. DO NOT PUSH THROUGH PAIN.  Let pain be your guide: If it hurts to do something, don't do it.  Pain is your body warning you to avoid that activity for another week until the pain goes down. d. You may drive when you are no longer taking prescription pain medication, you can comfortably wear a seatbelt, and you can safely maneuver your car and apply brakes. e. Bonita QuinYou may have sexual intercourse when it is comfortable.  9. FOLLOW UP in our  office a. Please call [facility] at [phone] to set up an appointment to see your surgeon in the office for a follow-up appointment approximately 2-3 weeks after your surgery. b. Make sure that you call for this appointment the day you arrive home to insure a convenient appointment time. 10. IF YOU HAVE DISABILITY OR FAMILY LEAVE FORMS, BRING THEM TO THE OFFICE FOR PROCESSING.  DO NOT GIVE THEM TO YOUR DOCTOR.   WHEN TO CALL US [phone]: 1. Poor pain control 2. Reactions / problems with new medications (rash/itching, nausea, etc)  3. Fever over 101.5 F (38.5 C) 4. Inability to urinate 5. Nausea and/or vomiting 6. Worsening swelling or bruising 7. Continued bleeding from incision. 8. Increased pain, redness, or drainage from the incision   The clinic staff is available to answer your questions during regular business hours (8:30am-5pm).  Please dont hesitate to call and ask to speak to one of our nurses for clinical concerns.   If you have a medical emergency, go to the nearest emergency room or call 911.  [name] always [facility] at the [facility]   [facility] [place] [address], [address], [place], [place]  [address] ? MAIN: [phone] ? TOLL FREE: [phone] ?  FAX [phone] [email or web address]

## 2016-01-31 NOTE — Anesthesia Preprocedure Evaluation (Addendum)
Anesthesia Evaluation  Patient identified by MRN, date of birth, ID band Patient awake    Reviewed: Allergy & Precautions, NPO status , Patient's Chart, lab work & pertinent test results  Airway Mallampati: II       Dental  (+) Teeth Intact, Dental Advisory Given   Pulmonary    breath sounds clear to auscultation       Cardiovascular hypertension,  Rhythm:Regular Rate:Normal     Neuro/Psych    GI/Hepatic   Endo/Other    Renal/GU      Musculoskeletal   Abdominal   Peds  Hematology   Anesthesia Other Findings   Reproductive/Obstetrics                            Anesthesia Physical Anesthesia Plan  ASA: II  Anesthesia Plan: General   Post-op Pain Management:    Induction: Intravenous  Airway Management Planned: Oral ETT  Additional Equipment:   Intra-op Plan:   Post-operative Plan: Extubation in OR  Informed Consent: I have reviewed the patients History and Physical, chart, labs and discussed the procedure including the risks, benefits and alternatives for the proposed anesthesia with the patient or authorized representative who has indicated his/her understanding and acceptance.   Dental advisory given  Plan Discussed with: CRNA and Anesthesiologist  Anesthesia Plan Comments:         Anesthesia Quick Evaluation

## 2016-01-31 NOTE — Progress Notes (Addendum)
Patient up to bathroom ; however, unable to void. Right lateral dressing is oozing serosanguinous drainage. Dressing changed using two folded 2x2 and tegaderm cover.  1330   Right lateral dressing is saturated and blood is pooled under tegaderm dresssing. Dressing is changed using four folded 2x2 and tegaderm. Called Dr Michaell CowingGross. Order received to apply ice bag and pressure for 20 minutes and then change dressing.  1345  Pressure and ice bag over saturated dressing. After 20 minutes, clean dressing using four 2x2 and tegaderm applied. Patient up to ambulate in hallway. Tolerated well.

## 2016-01-31 NOTE — Op Note (Signed)
01/31/2016  PATIENT:  Austin [name]  41 y.o. [name]  Patient Care Team: Alysia Penna, MD as PCP - General (Internal Medicine) [name], MD as Consulting Physician (General Surgery) [name], MD as Consulting Physician (Gastroenterology)  PRE-OPERATIVE DIAGNOSIS:    SYMPTOMATIC BILIARY COLIC PROBABLE CHRONIC CHOLECYSTITIS  APPENDICEAL THICKENING, POSSIBLE CHRONIC APPENDICITIS   POST-OPERATIVE DIAGNOSIS:    SYMPTOMATIC BILIARY COLIC PROBABLE CHRONIC cholecystitis APPENDICEAL THICKENING, POSSIBLE CHRONIC APPENDICITIS  FATTY CHANGE ON LIVER, POSSIBLE STEATOHEPATITIS  PROCEDURE:   LAPAROSCOPIC APPENDECTOMY  LAPAROSCOPIC CHOLECYSTECTOMY WITH INTRAOPERATIVE CHOLANGIOGRAM DIAGNOSTIC LAPAROSCOPY CORE NEEDLE BIOPSY OF LIVER X 3  SURGEON:  Ardeth Sportsman, MD  ASSISTANT: Flint Melter, PA-S, [employer]   ANESTHESIA:   local and general  EBL:  Total I/O In: 2480 [P.O.:480; I.V.:2000] Out: 705 [Urine:680; Blood:25]  Delay start of Pharmacological VTE agent (>24hrs) due to surgical blood loss or risk of bleeding:  no  DRAINS: none   SPECIMEN:    1.  Gallbladder 2.  Appendix 3.  Liver core biopsies x 3   DISPOSITION OF SPECIMEN:  PATHOLOGY  COUNTS:  YES  PLAN OF CARE: Discharge to home after PACU  PATIENT DISPOSITION:  PACU - hemodynamically stable.  INDICATION: Austin [name] with vague right-sided abdominal [name].  CAT scan showed some appendiceal thickening suspicious for abnormal mass or chronic appendicitis.  History and physical more suspicious for symptom and gallbladder though.  No stones but reproduction is systems and decreased gallbladder ejection fraction with HIDA scan with gallbladder stimulation.  Suspicious for chronic acalculous cholecystitis.  The gastrointestinal workup negative.  He offered cholecystectomy and appendectomy.  The anatomy & physiology of hepatobiliary & pancreatic function was discussed.  The pathophysiology of  gallbladder dysfunction was discussed.  Natural history risks without surgery was discussed.   I feel the risks of no intervention will lead to serious problems that outweigh the operative risks; therefore, I recommended cholecystectomy to remove the pathology.  I explained laparoscopic techniques with possible need for an open approach.  Probable cholangiogram to evaluate the bilary tract was explained as well.    The anatomy & physiology of the digestive tract was discussed.  The pathophysiology of appendicitis was discussed.  Natural history risks without surgery was discussed.   I feel the risks of no intervention will lead to serious problems that outweigh the operative risks; therefore, I recommended diagnostic laparoscopy with removal of appendix to remove the pathology.  Laparoscopic & open techniques were discussed.   I noted a good likelihood this will help address the problem.    Risks such as bleeding, infection, abscess, leak, injury to other organs, need for further treatment, heart attack, death, and other risks were discussed.  I noted a good likelihood this will help address the problem.  Possibility that this will not correct all abdominal symptoms was explained.  Goals of post-operative recovery were discussed as well.  We will work to minimize complications.  An educational handout further explaining the pathology and treatment options was given as well.  Questions were answered.  The patient expresses understanding & wishes to proceed with surgery.  OR FINDINGS: Dilated boggy gallbladder suspicious for chronic cholecystitis.  Cholangiogram with normal biliary anatomy.  No common bile duct stones.  Fatty change in the liver.  Not classic for cirrhosis.  Given occasional abnormal liver function tests, I did core liver biopsies.  Mild thickening of the appendix but no frank appendicitis or perforation.  No abnormal tip.  Rest of intra-abdominal anatomy normal.  No bowel obstruction.   No Meckel's.  No hernia.  Stomach and colon and small intestine otherwise normal.  DESCRIPTION:   The patient was identified & brought into the operating room. The patient was positioned supine with arms tucked. SCDs were active during the entire case. The patient underwent general anesthesia without any difficulty.  The abdomen was prepped and draped in a sterile fashion. A Surgical Timeout confirmed our plan.  We positioned the patient in reverse [place] & left side up.  I placed a 5mm laparoscopic port through the abdominal wall using optical entry technique in the left upper quadrant.  Entry was clean.  We induced carbon dioxide insufflation. Camera inspection revealed no injury.  There were no adhesions to the anterior abdominal wall supraumbilically.  I proceeded to continue with laparoscopic technique. I placed extra ports under laparoscopic guidance.  I turned attention to the right upper quadrant.  The gallbladder fundus was elevated cephalad.  I freed adhesions to the anterior surface of the gallbladder wall.  Suspicious for chronic cholecystitis.  Gallbladder was mildly edematous but not particularly inflamed.  I freed the peritoneal coverings between the gallbladder and the liver on the posteriolateral and anteriomedial walls.   I used careful blunt and hook dissection to help get a good critical view of the cystic artery and cystic duct. I did further dissection to free a few centimeters of the  gallbladder off the liver bed to get a good critical view of the infundibulum and cystic duct. I mobilized the cystic artery; and, after getting a good 360 view, ligated the cystic artery using clips. I skeletonized the cystic duct.  I placed a clip on the infundibulum. I did a partial cystic duct-otomy and ensured patency. I placed a 5 [place] cholangiocatheter through a puncture site at the right subcostal ridge of the abdominal wall and directed it into the cystic duct.  We ran a  cholangiogram with dilute radio-opaque contrast and continuous fluoroscopy.  Contrast flowed from a side branch consistent with cystic duct cannulization. Contrast flowed up the common hepatic duct into the right and left intrahepatic chains out to secondary radicals. Contrast flowed down the common bile duct easily across the normal ampulla into the duodenum.  This was consistent with a normal cholangiogram.  I removed the cholangiocatheter. I placed clips on the cystic duct x4.  I completed cystic duct transection. I freed the gallbladder from its remaining attachments to the liver. I ensured hemostasis on the gallbladder fossa of the liver and elsewhere. I inspected the rest of the abdomen & detected no injury nor bleeding elsewhere.  I removed the gallbladder.  Then I focused on diagnostic laparoscopy.  I found the ileocecal region.  I ran the small bowel proximal towards the ligament of Treitz.  No adhesions.  No Meckel's diverticulum.  No abnormalities.  The liver did have some fatty change on it.  I went ahead and did core liver biopsies using a 14-gauge true cut biopsies to the anterior surface of the right hepatic lobe.  Got 2-1/2 good cores.  I assured hemostasis.  The stomach and the colon appeared normal.  Next I focused on appendectomy given the abnormal CT scan findings.  I mobilized the terminal ileum to proximal ascending colon in a lateral to medial fashion.  I took care to avoid injuring any retroperitoneal structures.  Was able to locate a retrocecal appendix with the tip underneath the medial ascending colon mesentery.  I chose able to elevate it and  bring it anteriorly.   I freed the appendix off its attachments to the ascending colon and cecal mesentery.  I elevated the appendix. I skeletonized the mesoappendix. I was able to free off the base of the appendix which was still viable.  I stapled the appendix off the cecum using a laparoscopic stapler. I took a healthy cuff of viable cecum.  I ligated the mesoappendix and assured hemostasis in the mesentery.  I placed the appendix inside an EndoCatch bag and removed out the 12 mm port.  I did copious irrigation. Hemostasis was good in the mesoappendix, colon mesentery, and retroperitoneum. Staple line was intact on the cecum with no bleeding. I washed out the pelvis, retrohepatic space and right paracolic gutter. I washed out the left side as well.  Hemostasis is good. There was no perforation or injury.  Because the area cleaned up well after irrigation, I did not place a drain.  I closed the periumbilical stapler port with #1 PDS 2 passes using a laparoscopic suture passer under direct visualization.  I aspirated the carbon dioxide. I removed the ports.  I closed skin using 4-0 monocryl stitch.  Patient was extubated and sent to the recovery room.  I had discussed postoperative care with the patient in the holding area.   I am about to locate the patient's family and discuss operative findings and postoperative goals / instructions.  Instructions are written in the chart as well.  Ardeth SportsmanSteven C. Kenzi Bardwell, M.D., F.A.C.S. Gastrointestinal and Minimally Invasive Surgery [facility] [address] [place], [place] [id number] [phone] Main / Paging  01/31/2016 4:47 PM

## 2016-01-31 NOTE — Anesthesia Procedure Notes (Signed)
Procedure Name: Intubation Date/Time: 01/31/2016 7:51 AM Performed by: Carleene Cooper A Pre-anesthesia Checklist: Patient identified, Timeout performed, Emergency Drugs available, Suction available and Patient being monitored Patient Re-evaluated:Patient Re-evaluated prior to inductionOxygen Delivery Method: Circle system utilized Preoxygenation: Pre-oxygenation with 100% oxygen Intubation Type: IV induction Ventilation: Oral airway inserted - appropriate to patient size and Two handed mask ventilation required Laryngoscope Size: Mac and 4 Grade View: Grade I Tube type: Oral Tube size: 7.5 mm Number of attempts: 1 Airway Equipment and Method: Stylet Placement Confirmation: ETT inserted through vocal cords under direct vision,  positive ETCO2 and breath sounds checked- equal and bilateral Secured at: 22 cm Tube secured with: Tape Dental Injury: Teeth and Oropharynx as per pre-operative assessment

## 2016-01-31 NOTE — H&P (Signed)
Austin [name] 12/10/2015 9:50 AM Location: [facility] Patient #: [id number] DOB: Jan 02, 1976 Single / Language: [place] / Race: White Male  Patient Care Team: Alysia Penna, MD as PCP - General (Internal Medicine) [name], MD as Consulting Physician (General Surgery) [name], MD as Consulting Physician (Gastroenterology)   History of Present Illness The patient is a 41 year old male who presents with non-malignant abdominal pain. Note for "Non-malignant abdominal pain": Patient sent for surgical consultation for abdominal pains and abnormal appendix.  Male. No history of prior gastrointestinal problems or surgery problems. On hollowing, had some sharp abdominal pain. Seen to be in the lower abdomen but then occasionally radiating into the left upper quadrant. Got full and bloated. Gassy. Felt nauseated. Thought to perhaps be more heartburn or reflux. Start on Prilosec. Proved. Still with intermittent attacks. Saw gastroenterology. CT scan ordered. Appendix tip mildly enlarged. Not classic for appendicitis though. Surgical consultation requested. Started on oral Augmentin.  Patient notes the pain seems to be more in the lower abdomen but occasionally will get fullness under his rib cage. It wakes him up his night. He is tried to switch to a more heart healthy diet without any major improvement. Put on Prilosec & MiraLAX. Moves his bowels a couple times a day. His bowels a bit more loose on the Augmentin. He's had periods where he feels pretty uncomfortable for up to a week. That he'll feel okay. He had a time where he seemed to be getting better but then it flared back up again last week. Had a bad day yesterday as well.  No personal nor family history of GI/colon cancer, inflammatory bowel disease, irritable bowel syndrome, allergy such as Celiac Sprue, dietary/dairy problems, colitis, ulcers nor gastritis. No recent sick  contacts/gastroenteritis. No travel outside the country. No changes in diet. No dysphagia to solids or liquids. No significant heartburn or reflux. No hematochezia, hematemesis, coffee ground emesis. No evidence of prior gastric/peptic ulceration.   No new events.  D/w patient many times.  He is ready for surgery  Other Problems Ethlyn Gallery, CMA; 12/10/2015 9:51 AM) High blood pressure  Migraine Headache   Past Surgical History Elease Hashimoto Spillers, CMA; 12/10/2015 9:51 AM) Knee Surgery  Bilateral. Shoulder Surgery  Left.  Diagnostic Studies History Ethlyn Gallery, CMA; 12/10/2015 9:51 AM) Colonoscopy  never  Allergies Elease Hashimoto Spillers, CMA; 12/10/2015 9:52 AM) No Known Drug Allergies 12/10/2015  Medication History (Alisha Spillers, CMA; 12/10/2015 9:52 AM) Augmentin (875-125MG  Tablet, Oral) Active. Medications Reconciled  Social History Ethlyn Gallery, CMA; 12/10/2015 9:51 AM) Alcohol use  Moderate alcohol use. Caffeine use  Carbonated beverages, Coffee. No drug use  Tobacco use  Never smoker.  Family History Ethlyn Gallery, CMA; 12/10/2015 9:51 AM) Alcohol Abuse  Brother, Family Members In General. Arthritis  Family Members In General, Mother. Breast Cancer  Family Members In General. Diabetes Mellitus  Brother, Father. Heart Disease  Family Members In General. Hypertension  Brother, Family Members In General. Kidney Disease  Brother.    Review of Systems Elease Hashimoto Spillers CMA; 12/10/2015 9:51 AM) General Present- Fatigue. Not Present- Appetite Loss, Chills, Fever, Night Sweats, Weight Gain and Weight Loss. Skin Not Present- Change in Wart/Mole, Dryness, Hives, Jaundice, New Lesions, Non-Healing Wounds, Rash and Ulcer. HEENT Not Present- Earache, Hearing Loss, Hoarseness, Nose Bleed, Oral Ulcers, Ringing in the Ears, Seasonal Allergies, Sinus Pain, Sore Throat, Visual Disturbances, Wears glasses/contact lenses and Yellow Eyes. Respiratory  Not Present- Bloody sputum, Chronic Cough, Difficulty Breathing, Snoring and Wheezing. Breast  Not Present- Breast Mass, Breast Pain, Nipple Discharge and Skin Changes. Cardiovascular Not Present- Chest Pain, Difficulty Breathing Lying Down, Leg Cramps, Palpitations, Rapid Heart Rate, Shortness of Breath and Swelling of Extremities. Gastrointestinal Present- Abdominal Pain, Bloating, Change in Bowel Habits, Excessive gas, Gets full quickly at meals and Nausea. Not Present- Bloody Stool, Chronic diarrhea, Constipation, Difficulty Swallowing, Hemorrhoids, Indigestion, Rectal Pain and Vomiting. Male Genitourinary Not Present- Blood in Urine, Change in Urinary Stream, Frequency, Impotence, Nocturia, Painful Urination, Urgency and Urine Leakage. Musculoskeletal Present- Back Pain. Not Present- Joint Pain, Joint Stiffness, Muscle Pain, Muscle Weakness and Swelling of Extremities. Neurological Present- Headaches. Not Present- Decreased Memory, Fainting, Numbness, Seizures, Tingling, Tremor, Trouble walking and Weakness. Psychiatric Not Present- Anxiety, Bipolar, Change in Sleep Pattern, Depression, Fearful and Frequent crying. Endocrine Not Present- Cold Intolerance, Excessive Hunger, Hair Changes, Heat Intolerance, Hot flashes and New Diabetes. Hematology Not Present- Blood Thinners, Easy Bruising, Excessive bleeding, Gland problems, HIV and Persistent Infections.  Vitals (Alisha Spillers CMA; 12/10/2015 9:52 AM) 12/10/2015 9:51 AM Weight: 229 lb Height: 69in Body Surface Area: 2.19 m Body Mass Index: 33.82 kg/m  Pulse: 80 (Regular)  BP: 142/80 (Sitting, Left Arm, Standard)       Physical Exam Ardeth Sportsman(Ada Holness C. Harshita Bernales MD; 12/10/2015 10:13 AM) General Mental Status-Alert. General Appearance-Not in acute distress, Not Sickly. Orientation-Oriented X3. Hydration-Well hydrated. Voice-Normal. Note: Called. Relaxed. Not toxic. Not sickly.   Integumentary Global Assessment Upon  inspection and palpation of skin surfaces of the - Axillae: non-tender, no inflammation or ulceration, no drainage. and Distribution of scalp and body hair is normal. General Characteristics Temperature - normal warmth is noted.  Head and Neck Head-normocephalic, atraumatic with no lesions or palpable masses. Face Global Assessment - atraumatic, no absence of expression. Neck Global Assessment - no abnormal movements, no bruit auscultated on the right, no bruit auscultated on the left, no decreased range of motion, non-tender. Trachea-midline. Thyroid Gland Characteristics - non-tender.  Eye Eyeball - Left-Extraocular movements intact, No Nystagmus. Eyeball - Right-Extraocular movements intact, No Nystagmus. Cornea - Left-No Hazy. Cornea - Right-No Hazy. Sclera/Conjunctiva - Left-No scleral icterus, No Discharge. Sclera/Conjunctiva - Right-No scleral icterus, No Discharge. Pupil - Left-Direct reaction to light normal. Pupil - Right-Direct reaction to light normal.  ENMT Ears Pinna - Left - no drainage observed, no generalized tenderness observed. Right - no drainage observed, no generalized tenderness observed. Nose and Sinuses External Inspection of the Nose - no destructive lesion observed. Inspection of the nares - Left - quiet respiration. Right - quiet respiration. Mouth and Throat Lips - Upper Lip - no fissures observed, no pallor noted. Lower Lip - no fissures observed, no pallor noted. Nasopharynx - no discharge present. Oral Cavity/Oropharynx - Tongue - no dryness observed. Oral Mucosa - no cyanosis observed. Hypopharynx - no evidence of airway distress observed.  Chest and Lung Exam Inspection Movements - Normal and Symmetrical. Accessory muscles - No use of accessory muscles in breathing. Palpation Palpation of the chest reveals - Non-tender. Auscultation Breath sounds - Normal and Clear.  Cardiovascular Auscultation Rhythm - Regular. Murmurs &  Other Heart Sounds - Auscultation of the heart reveals - No Murmurs and No Systolic Clicks.  Abdomen Inspection Inspection of the abdomen reveals - No Visible peristalsis and No Abnormal pulsations. Umbilicus - No Bleeding, No Urine drainage. Palpation/Percussion Palpation and Percussion of the abdomen reveal - Soft, Non Tender, No Rebound tenderness, No Rigidity (guarding) and No Cutaneous hyperesthesia. Note: Vague abdominal discomfort in right upper quadrant, right lower quarter,  left lower quadrant. No epigastric discomfort. No real periumbilical discomfort. No guarding. No reproduction of pain with cough or bed shake. No diastases. Abdomen soft. Nondistended. No guarding. No umbilical no other hernias   Male Genitourinary Sexual Maturity Tanner 5 - Adult hair pattern and Adult penile size and shape. Note: No inguinal hernias. Normal external genitalia.   Peripheral Vascular Upper Extremity Inspection - Left - No Cyanotic nailbeds, Not Ischemic. Right - No Cyanotic nailbeds, Not Ischemic.  Neurologic Neurologic evaluation reveals -normal attention span and ability to concentrate, able to name objects and repeat phrases. Appropriate fund of knowledge , normal sensation and normal coordination. Mental Status Affect - not angry, not paranoid. Cranial Nerves-Normal Bilaterally. Gait-Normal.  Neuropsychiatric Mental status exam performed with findings of-able to articulate well with normal speech/language, rate, volume and coherence, thought content normal with ability to perform basic computations and apply abstract reasoning and no evidence of hallucinations, delusions, obsessions or homicidal/suicidal ideation.  Musculoskeletal Global Assessment Spine, Ribs and Pelvis - no instability, subluxation or laxity. Right Upper Extremity - no instability, subluxation or laxity.  Lymphatic Head & Neck  General Head & Neck Lymphatics: Bilateral - Description - No Localized  lymphadenopathy. Axillary  General Axillary Region: Bilateral - Description - No Localized lymphadenopathy. Femoral & Inguinal  Generalized Femoral & Inguinal Lymphatics: Left - Description - No Localized lymphadenopathy. Right - Description - No Localized lymphadenopathy.    Assessment & Plan Ardeth Sportsman MD; 12/10/2015 1:35 PM) ABDOMINAL PAIN, DIFFUSE (R10.84) Impression: Intermittent episodes of abdominal pain that seemed more classic for biliary colic except for the fact that his pain does not seem to be really right sided and seems to be more lower than upper abdomen. Not classic for appendicitis. CT scan with subtle tip enlargement but no real inflammation. I would think that if he truly had appendicitis, it would be very severe by now. I'm skeptical that he really has chronic, intermittent, recurring appendicitis  I think he would benefit from further testing to rule in/rule out certain potential etiologies.  I would like to get an ultrasound. If that shows gallstones, consider cholecystectomy if everything else is ruled out. If there are no gallstones, get a HIDA scan with gallbladder ejection fraction. If normal GBEF and asymptomatic, that argues against a gallbladder etiology. If decreased gallbladder ejection fraction and reproduction of symptoms, consider cholecystectomy.  Should it come to surgery, I would proceed with appendectomy just in case. I would do a formal diagnostic laparoscopy. Include running the bowel. Rule out any adhesions or other abnormalities. Rule out a Meckel's or other abnormalities. Current Plans Follow Up - Call [facility] office after tests / studies done to discuss further plans Pt Education - [facility] - General recommendations Pt Education - [facility] Good Bowel Health (Kimmy Totten)  I have re-reviewed the the patient's records, history, medications, and allergies.  I have re-examined the patient.  I again discussed intraoperative plans and goals of post-operative recovery.   The patient agrees to proceed.  Austin [name]  [birth year] [id number]  Patient Care Team: Alysia Penna, MD as PCP - General (Internal Medicine) [name], MD as Consulting Physician (General Surgery) [name], MD as Consulting Physician (Gastroenterology)  There are no active problems to display for this patient.   Past Medical History:  Diagnosis Date  . GERD (gastroesophageal reflux disease)   . Hypertension    not on meds   . Migraines   . PONV (postoperative nausea and vomiting)     Past Surgical  History:  Procedure Laterality Date  . KNEE SURGERY Bilateral   . SHOULDER SURGERY Left   . WISDOM TOOTH EXTRACTION      Social History   Social History  . Marital status: Single    Spouse name: N/A  . Number of children: N/A  . Years of education: N/A   Occupational History  . Not on file.   Social History Main Topics  . Smoking status: Never Smoker  . Smokeless tobacco: Never Used  . Alcohol use Yes     Comment: 1-2 drinks daily  . Drug use: No  . Sexual activity: Not on file   Other Topics Concern  . Not on file   Social History Narrative  . No narrative on file    Family History  Problem Relation Age of Onset  . Diabetes Father   . Diabetes Brother   . Kidney disease Brother   . Breast cancer Maternal Grandmother   . Heart disease Maternal Grandmother   . Heart disease Maternal Grandfather   . Heart disease Paternal Grandmother   . Heart disease Paternal Grandfather   . Colon cancer Neg Hx   . Stomach cancer Neg Hx   . Pancreatic cancer Neg Hx   . Esophageal cancer Neg Hx   . Liver disease Neg Hx     Current Facility-Administered Medications  Medication Dose Route Frequency Provider Last Rate Last Dose  . 0.9 % irrigation (POUR BTL)    PRN [name], MD   1,000 mL at 01/31/16 0737  . cefoTEtan in Dextrose 5% (CEFOTAN) IVPB 2 g  2 g Intravenous On Call to OR [name], MD      . lactated ringers irrigation solution     PRN [name], MD   1,000 mL at 01/31/16 0736  . sterile water for irrigation for irrigation    PRN [name], MD   1,000 mL at 01/31/16 0737   Facility-Administered Medications Ordered in Other Encounters  Medication Dose Route Frequency Provider Last Rate Last Dose  . lactated ringers infusion    Continuous PRN Elisabeth Cara, CRNA      . midazolam (VERSED) 5 MG/5ML injection    Anesthesia Intra-op Lauree Chandler Armistead, CRNA   2 mg at 01/31/16 0740     No Known Allergies  BP (!) 148/90   Pulse 100   Temp 97.7 F (36.5 C) (Oral)   Resp 18   Ht 5\' 9"  (1.753 m)   Wt 103.9 kg (229 lb)   SpO2 100%   BMI 33.82 kg/m   Labs: No results found for this or any previous visit (from the past 48 hour(s)).  Imaging / Studies: No results found.   Ardeth Sportsman, M.D., F.A.C.S. Gastrointestinal and Minimally Invasive Surgery [facility] [address], [address] [place], [place] [address] [phone] Main / Paging  01/31/2016 7:40 AM

## 2016-02-04 ENCOUNTER — Telehealth: Payer: Self-pay

## 2016-02-04 NOTE — Telephone Encounter (Signed)
Patient scheduled for follow up on 2/21.

## 2016-03-12 ENCOUNTER — Ambulatory Visit: Payer: Managed Care, Other (non HMO) | Admitting: Gastroenterology

## 2018-04-15 ENCOUNTER — Other Ambulatory Visit: Payer: Self-pay | Admitting: Physician Assistant

## 2018-08-02 IMAGING — NM NM HEPATO W/GB/PHARM/[PERSON_NAME]
2 series · 12 of 12 positions shown · non-contrast
Comparison: Abdominal ultrasound December 18, 2015

CLINICAL DATA: Early satiety, right upper quadrant pain, abdominal
cramping and bloating for the past 4 weeks. One week of reflux and
nausea.

EXAM:
NUCLEAR MEDICINE HEPATOBILIARY IMAGING WITH GALLBLADDER EF
TECHNIQUE: Sequential images of the abdomen were obtained [DATE] minutes
following intravenous administration of radiopharmaceutical. After
oral ingestion of Ensure, gallbladder ejection fraction was
determined. At 60 min, normal ejection fraction is greater than 33%.
RADIOPHARMACEUTICALS:  5.2 mCi Ac-KKm  Choletec IV

[he hepatobiliary · 3.43mm/px · 6 of 60 frames shown (1 of 2)]
[frame 6/60]
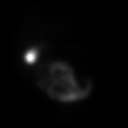
[frame 16/60]
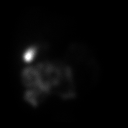
[frame 26/60]
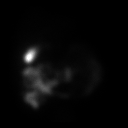
[frame 36/60]
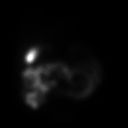
[frame 46/60]
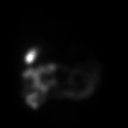
[frame 56/60]
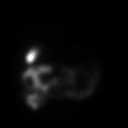

[he hepatobiliary · 3.43mm/px · 6 of 60 frames shown (2 of 2)]
[frame 6/60]
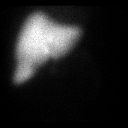
[frame 16/60]
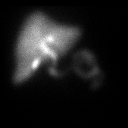
[frame 26/60]
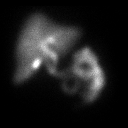
[frame 36/60]
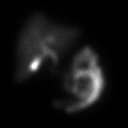
[frame 46/60]
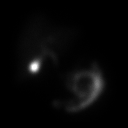
[frame 56/60]
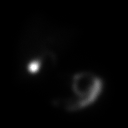

[12 of 12 positions shown; findings below may reference images not displayed]

FINDINGS: Prompt uptake and biliary excretion of activity by the liver is
seen. Gallbladder activity is visualized, consistent with patency of
cystic duct. Biliary activity passes into small bowel, consistent
with patent common bile duct.

Calculated gallbladder ejection fraction is 32%. (Normal gallbladder
ejection fraction with Ensure is greater than 33%.) the patient
there is no discomfort with Ensure ingestion.
IMPRESSION: Low gallbladder ejection fraction consistent with gallbladder
dysfunction. No evidence of cystic duct or common bile duct
obstruction.
